# Patient Record
Sex: Male | Born: 1960 | Race: White | Hispanic: No | Marital: Married | Smoking: Never smoker
Health system: Southern US, Community
[De-identification: ages and names within clinical notes are randomized; demographics above are authoritative.]

## PROBLEM LIST (undated history)

## (undated) DIAGNOSIS — I10 Essential (primary) hypertension: Secondary | ICD-10-CM

## (undated) DIAGNOSIS — E785 Hyperlipidemia, unspecified: Secondary | ICD-10-CM

## (undated) DIAGNOSIS — M5416 Radiculopathy, lumbar region: Secondary | ICD-10-CM

## (undated) DIAGNOSIS — L570 Actinic keratosis: Secondary | ICD-10-CM

## (undated) DIAGNOSIS — E119 Type 2 diabetes mellitus without complications: Secondary | ICD-10-CM

## (undated) DIAGNOSIS — M51369 Other intervertebral disc degeneration, lumbar region without mention of lumbar back pain or lower extremity pain: Secondary | ICD-10-CM

## (undated) DIAGNOSIS — M1990 Unspecified osteoarthritis, unspecified site: Secondary | ICD-10-CM

## (undated) DIAGNOSIS — M5136 Other intervertebral disc degeneration, lumbar region: Secondary | ICD-10-CM

## (undated) DIAGNOSIS — N402 Nodular prostate without lower urinary tract symptoms: Secondary | ICD-10-CM

## (undated) HISTORY — DX: Nodular prostate without lower urinary tract symptoms: N40.2

## (undated) HISTORY — DX: Type 2 diabetes mellitus without complications: E11.9

## (undated) HISTORY — PX: HERNIA REPAIR: SHX51

## (undated) HISTORY — DX: Other intervertebral disc degeneration, lumbar region without mention of lumbar back pain or lower extremity pain: M51.369

## (undated) HISTORY — DX: Actinic keratosis: L57.0

## (undated) HISTORY — DX: Radiculopathy, lumbar region: M54.16

## (undated) HISTORY — DX: Hyperlipidemia, unspecified: E78.5

## (undated) HISTORY — PX: SHOULDER SURGERY: SHX246

## (undated) HISTORY — DX: Other intervertebral disc degeneration, lumbar region: M51.36

---

## 2008-09-21 ENCOUNTER — Ambulatory Visit: Payer: Self-pay | Admitting: Family Medicine

## 2008-10-03 ENCOUNTER — Ambulatory Visit: Payer: Self-pay | Admitting: Family Medicine

## 2008-10-15 ENCOUNTER — Ambulatory Visit: Payer: Self-pay

## 2010-12-30 ENCOUNTER — Ambulatory Visit: Payer: Self-pay | Admitting: Family Medicine

## 2012-09-30 ENCOUNTER — Ambulatory Visit: Payer: Self-pay | Admitting: Unknown Physician Specialty

## 2012-10-05 LAB — PATHOLOGY REPORT

## 2014-03-23 ENCOUNTER — Ambulatory Visit (INDEPENDENT_AMBULATORY_CARE_PROVIDER_SITE_OTHER): Payer: BC Managed Care – PPO | Admitting: Podiatry

## 2014-03-23 ENCOUNTER — Encounter: Payer: Self-pay | Admitting: Podiatry

## 2014-03-23 ENCOUNTER — Ambulatory Visit (INDEPENDENT_AMBULATORY_CARE_PROVIDER_SITE_OTHER): Payer: BC Managed Care – PPO

## 2014-03-23 VITALS — Resp 16 | Ht 67.0 in | Wt 190.0 lb

## 2014-03-23 DIAGNOSIS — M722 Plantar fascial fibromatosis: Secondary | ICD-10-CM

## 2014-03-23 DIAGNOSIS — M79609 Pain in unspecified limb: Secondary | ICD-10-CM

## 2014-03-23 DIAGNOSIS — M79673 Pain in unspecified foot: Secondary | ICD-10-CM

## 2014-03-23 MED ORDER — DICLOFENAC SODIUM 75 MG PO TBEC: 75.0000 mg | DELAYED_RELEASE_TABLET | Freq: Two times a day (BID) | ORAL | Status: DC

## 2014-03-23 MED ORDER — TRIAMCINOLONE ACETONIDE 10 MG/ML IJ SUSP
10.0000 mg | Freq: Once | INTRAMUSCULAR | Status: AC
  Administered 2014-03-23: 10 mg

## 2014-03-23 NOTE — Progress Notes (Signed)
   Subjective:    Patient ID: Jorge Grant, male    DOB: 12-Sep-1961, 53 y.o.   MRN: [id number]  HPI Comments: N pain L rt heel D 3 - 4 m O ? C worse A walking, shoes T 0  Foot Pain      Review of Systems  All other systems reviewed and are negative.      Objective:   Physical Exam        Assessment & Plan:

## 2014-03-23 NOTE — Patient Instructions (Signed)

## 2014-03-23 NOTE — Progress Notes (Signed)
Subjective:     Patient ID: Jorge Grant, male   DOB: 09-29-1961, 53 y.o.   MRN: [id number]  Foot Pain   patient points of the right heel stating that has been hurting a lot and making walking difficult and he had stand. States it's been present for around 4 months   Review of Systems  All other systems reviewed and are negative.      Objective:   Physical Exam  Nursing note and vitals reviewed. Constitutional: He is oriented to person, place, and time.  Cardiovascular: Intact distal pulses.   Musculoskeletal: Normal range of motion.  Neurological: He is oriented to person, place, and time.  Skin: Skin is warm.   neurovascular status intact with muscle strength adequate in range of motion subtalar and midtarsal joint within normal limits. Patient is found to have pain in the medial fascial band right at the insertion of the tendon into the calcaneus with fluid buildup noted around the insertion     Assessment:     Plantar fasciitis right heel with inflammation and fluid around the medial band at the insertion    Plan:     H&P and x-ray reviewed. Injected the right plantar fascia 3 mg Kenalog 5 mg arch and Marcaine mixture dispense fascial brace placed on Voltaren 75 mg twice a day and gave instructions on physical therapy. Discussed long-term orthotics and reappoint in 1 week

## 2014-03-30 ENCOUNTER — Ambulatory Visit (INDEPENDENT_AMBULATORY_CARE_PROVIDER_SITE_OTHER): Payer: BC Managed Care – PPO | Admitting: Podiatry

## 2014-03-30 ENCOUNTER — Encounter: Payer: Self-pay | Admitting: Podiatry

## 2014-03-30 VITALS — BP 134/70 | HR 84 | Resp 16

## 2014-03-30 DIAGNOSIS — M722 Plantar fascial fibromatosis: Secondary | ICD-10-CM

## 2014-03-30 MED ORDER — TRIAMCINOLONE ACETONIDE 10 MG/ML IJ SUSP
10.0000 mg | Freq: Once | INTRAMUSCULAR | Status: AC
  Administered 2014-03-30: 10 mg

## 2014-04-01 NOTE — Progress Notes (Signed)
Subjective:     Patient ID: Jorge Grant, male   DOB: [birth year], 52 y.o.   MRN: [id number]  HPI patient presents with continued chronic discomfort of the right plantar fascia at the insertion of the tendon into the calcaneus with trouble mostly with weightbearing and ambulation   Review of Systems     Objective:   Physical Exam Neurovascular status intact with continued discomfort in the right plantar fascia at the insertion of the tendon into the calcaneus    Assessment:     Plantar fasciitis right heel with inflammation and fluid at the calcaneal tendon insertion    Plan:     H&P reviewed and today discussed the chronic nature of his foot structure. I recommended long-term orthotics and scanned for custom orthotic devices and today I injected the right plantar fascia 3 mg Kenalog 5 mg Xylocaine Marcaine mixture and instructed on physical therapy. Reappoint to recheck

## 2014-04-12 ENCOUNTER — Encounter: Payer: Self-pay | Admitting: *Deleted

## 2014-04-12 NOTE — Progress Notes (Signed)
SENT PT POST CARD LETTING HIM KNOW ORTHOTICS ARE HERE. 

## 2014-04-27 ENCOUNTER — Other Ambulatory Visit: Payer: BC Managed Care – PPO

## 2015-11-29 ENCOUNTER — Encounter: Payer: Self-pay | Admitting: *Deleted

## 2015-11-29 ENCOUNTER — Encounter
Admission: RE | Disposition: A | Discharge status: Home Health | Payer: Self-pay | Source: Ambulatory Visit | Attending: Unknown Physician Specialty

## 2015-11-29 ENCOUNTER — Ambulatory Visit
Admission: RE | Admit: 2015-11-29 | Discharge: 2015-11-29 | Disposition: A | Discharge status: Home Health | Payer: BLUE CROSS/BLUE SHIELD | Source: Ambulatory Visit | Attending: Unknown Physician Specialty | Admitting: Unknown Physician Specialty

## 2015-11-29 ENCOUNTER — Ambulatory Visit: Payer: BLUE CROSS/BLUE SHIELD | Admitting: Anesthesiology

## 2015-11-29 DIAGNOSIS — I10 Essential (primary) hypertension: Secondary | ICD-10-CM | POA: Insufficient documentation

## 2015-11-29 DIAGNOSIS — K640 First degree hemorrhoids: Secondary | ICD-10-CM | POA: Diagnosis not present

## 2015-11-29 DIAGNOSIS — Z1211 Encounter for screening for malignant neoplasm of colon: Secondary | ICD-10-CM | POA: Insufficient documentation

## 2015-11-29 DIAGNOSIS — K621 Rectal polyp: Secondary | ICD-10-CM | POA: Insufficient documentation

## 2015-11-29 DIAGNOSIS — D125 Benign neoplasm of sigmoid colon: Secondary | ICD-10-CM | POA: Diagnosis not present

## 2015-11-29 HISTORY — DX: Essential (primary) hypertension: I10

## 2015-11-29 HISTORY — PX: COLONOSCOPY: SHX5424

## 2015-11-29 SURGERY — COLONOSCOPY
Anesthesia: General

## 2015-11-29 MED ORDER — MIDAZOLAM HCL 2 MG/2ML IJ SOLN
INTRAMUSCULAR | Status: DC | PRN
  Administered 2015-11-29: 2 mg via INTRAVENOUS

## 2015-11-29 MED ORDER — SODIUM CHLORIDE 0.9 % IV SOLN
INTRAVENOUS | Status: DC
  Administered 2015-11-29: 10000 mL via INTRAVENOUS

## 2015-11-29 MED ORDER — SODIUM CHLORIDE 0.9 % IV SOLN
INTRAVENOUS | Status: DC
  Administered 2015-11-29: 11:00:00 via INTRAVENOUS

## 2015-11-29 MED ORDER — PROPOFOL 500 MG/50ML IV EMUL
INTRAVENOUS | Status: DC | PRN
Start: 2015-11-29 — End: 2015-11-29
  Administered 2015-11-29: 160 ug/kg/min via INTRAVENOUS

## 2015-11-29 MED ORDER — LIDOCAINE HCL (CARDIAC) 20 MG/ML IV SOLN
INTRAVENOUS | Status: DC | PRN
  Administered 2015-11-29: 80 mg via INTRAVENOUS

## 2015-11-29 MED ORDER — EPHEDRINE SULFATE 50 MG/ML IJ SOLN
INTRAMUSCULAR | Status: DC | PRN
  Administered 2015-11-29: 10 mg via INTRAVENOUS

## 2015-11-29 NOTE — Transfer of Care (Signed)
Immediate Anesthesia Transfer of Care Note  Patient: Jorge Grant  Procedure(s) Performed: Procedure(s): COLONOSCOPY (N/A)  Patient Location: PACU and Endoscopy Unit  Anesthesia Type:General  Level of Consciousness: sedated  Airway & Oxygen Therapy: Patient Spontanous Breathing and Patient connected to nasal cannula oxygen  Post-op Assessment: Report given to RN and Post -op Vital signs reviewed and stable  Post vital signs: Reviewed and stable  Last Vitals: 11:19 97.2 104hr 96% 105/59 18 resp Filed Vitals:   11/29/15 0941  BP: 137/78  Pulse: 82  Temp: 36.2 C  Resp: 20    Complications: No apparent anesthesia complications

## 2015-11-29 NOTE — Addendum Note (Signed)
Addendum  created 11/29/15 1320 by Alvin Critchley, MD   Modules edited: Clinical Notes   Clinical Notes:  File: [id number]

## 2015-11-29 NOTE — Anesthesia Postprocedure Evaluation (Addendum)
Anesthesia Post Note  Patient: Jorge Grant  Procedure(s) Performed: Procedure(s) (LRB): COLONOSCOPY (N/A)  Patient location during evaluation: Endoscopy Anesthesia Type: General Level of consciousness: awake, awake and alert and oriented Pain management: pain level controlled Vital Signs Assessment: post-procedure vital signs reviewed and stable Respiratory status: spontaneous breathing Cardiovascular status: blood pressure returned to baseline Anesthetic complications: no    Last Vitals:  Filed Vitals:   11/29/15 1138 11/29/15 1148  BP: 109/63 105/70  Pulse: 77 82  Temp:    Resp: 28 19    Last Pain:  Filed Vitals:   11/29/15 1155  PainSc: Asleep                 Haruki Arnold

## 2015-11-29 NOTE — Anesthesia Preprocedure Evaluation (Signed)
Anesthesia Evaluation  Patient identified by MRN, date of birth, ID band Patient awake    Reviewed: Allergy & Precautions, NPO status , Patient's Chart, lab work & pertinent test results  Airway Mallampati: III  TM Distance: >3 FB Neck ROM: Full    Dental  (+) Chipped, Caps   Pulmonary neg pulmonary ROS,    Pulmonary exam normal breath sounds clear to auscultation       Cardiovascular hypertension, Pt. on medications Normal cardiovascular exam     Neuro/Psych negative neurological ROS  negative psych ROS   GI/Hepatic negative GI ROS, Neg liver ROS,   Endo/Other  negative endocrine ROS  Renal/GU negative Renal ROS  negative genitourinary   Musculoskeletal negative musculoskeletal ROS (+)   Abdominal Normal abdominal exam  (+)   Peds negative pediatric ROS (+)  Hematology negative hematology ROS (+)   Anesthesia Other Findings   Reproductive/Obstetrics                             Anesthesia Physical Anesthesia Plan  ASA: II  Anesthesia Plan: General   Post-op Pain Management:    Induction: Intravenous  Airway Management Planned: Nasal Cannula  Additional Equipment:   Intra-op Plan:   Post-operative Plan:   Informed Consent: I have reviewed the patients History and Physical, chart, labs and discussed the procedure including the risks, benefits and alternatives for the proposed anesthesia with the patient or authorized representative who has indicated his/her understanding and acceptance.   Dental advisory given  Plan Discussed with: CRNA and Surgeon  Anesthesia Plan Comments:         Anesthesia Quick Evaluation

## 2015-11-29 NOTE — H&P (Signed)
   Primary Care Physician:  Johna Roles, MD Primary Gastroenterologist:  Dr. Vira Agar  Pre-Procedure History & Physical: HPI:  Jorge Grant is a 54 y.o. male is here for an colonoscopy.   Past Medical History  Diagnosis Date  . Hypertension     Past Surgical History  Procedure Laterality Date  . Shoulder surgery Right   . Hernia repair      Prior to Admission medications   Medication Sig Start Date End Date Taking? Authorizing Provider  lisinopril (PRINIVIL,ZESTRIL) 10 MG tablet Take 10 mg by mouth daily.   Yes Historical Provider, MD  diclofenac (VOLTAREN) 75 MG EC tablet Take 1 tablet (75 mg total) by mouth 2 (two) times daily. 03/23/14   Wallene Huh, DPM  traMADol (ULTRAM) 50 MG tablet Take by mouth as needed.    Historical Provider, MD    Allergies as of 09/30/2015  . (No Known Allergies)    History reviewed. No pertinent family history.  Social History   Social History  . Marital Status: Married    Spouse Name: N/A  . Number of Children: N/A  . Years of Education: N/A   Occupational History  . Not on file.   Social History Main Topics  . Smoking status: Never Smoker   . Smokeless tobacco: Not on file  . Alcohol Use: Yes  . Drug Use: Not on file  . Sexual Activity: Not on file   Other Topics Concern  . Not on file   Social History Narrative    Review of Systems: See HPI, otherwise negative ROS  Physical Exam: BP 137/78 mmHg  Pulse 82  Temp(Src) 97.2 F (36.2 C) (Tympanic)  Resp 20  Ht 5\' 7"  (1.702 m)  Wt 86.183 kg (190 lb)  BMI 29.75 kg/m2  SpO2 98% General:   Alert,  pleasant and cooperative in NAD Head:  Normocephalic and atraumatic. Neck:  Supple; no masses or thyromegaly. Lungs:  Clear throughout to auscultation.    Heart:  Regular rate and rhythm. Abdomen:  Soft, nontender and nondistended. Normal bowel sounds, without guarding, and without rebound.   Neurologic:  Alert and  oriented x4;  grossly normal  neurologically.  Impression/Plan: Jorge Grant is here for an colonoscopy to be performed for [facility] colon polyps  Risks, benefits, limitations, and alternatives regarding  colonoscopy have been reviewed with the patient.  Questions have been answered.  All parties agreeable.   Gaylyn Cheers, MD  11/29/2015, 10:40 AM

## 2015-11-29 NOTE — Op Note (Signed)
[facility] Gastroenterology Patient Name: Jorge Grant Procedure Date: 11/29/2015 10:29 AM MRN: [id number] Account #: [id number] Date of Birth: 1961/11/09 Admit Type: Outpatient Age: 54 Room: [facility] ENDO ROOM 1 Gender: Male Note Status: Finalized Procedure:         Colonoscopy Indications:       High risk colon cancer surveillance: Personal history of                     sessile serrated colon polyp (10 mm or greater in size) Providers:         Manya Silvas, MD Referring MD:      Irven Easterly. Kary Kos, MD (Referring MD) Medicines:         Propofol per Anesthesia Complications:     No immediate complications. Procedure:         Pre-Anesthesia Assessment:                    - After reviewing the risks and benefits, the patient was                     deemed in satisfactory condition to undergo the procedure.                    After obtaining informed consent, the colonoscope was                     passed under direct vision. Throughout the procedure, the                     patient's blood pressure, pulse, and oxygen saturations                     were monitored continuously. The Colonoscope was                     introduced through the anus and advanced to the the cecum,                     identified by appendiceal orifice and ileocecal valve. The                     colonoscopy was performed without difficulty. The patient                     tolerated the procedure well. The quality of the bowel                     preparation was excellent. Findings:      Four sessile polyps were found in the rectum and in the sigmoid colon.       The polyps were diminutive in size. These polyps were removed with a       jumbo cold forceps. Resection and retrieval were complete.      Internal hemorrhoids were found during endoscopy. The hemorrhoids were       small and Grade I (internal hemorrhoids that do not prolapse).      The exam was otherwise without  abnormality. Impression:        - Four diminutive polyps in the rectum and in the sigmoid                     colon. Resected and retrieved.                    -  Internal hemorrhoids.                    - The examination was otherwise normal. Recommendation:    - Await pathology results. Manya Silvas, MD 11/29/2015 11:14:55 AM This report has been signed electronically. Number of Addenda: 0 Note Initiated On: 11/29/2015 10:29 AM Scope Withdrawal Time: 0 hours 16 minutes 8 seconds  Total Procedure Duration: 0 hours 26 minutes 9 seconds       [facility]

## 2015-11-30 ENCOUNTER — Encounter: Payer: Self-pay | Admitting: Unknown Physician Specialty

## 2015-12-02 LAB — SURGICAL PATHOLOGY

## 2016-11-16 ENCOUNTER — Encounter: Payer: Self-pay | Admitting: Urology

## 2016-11-16 ENCOUNTER — Ambulatory Visit (INDEPENDENT_AMBULATORY_CARE_PROVIDER_SITE_OTHER): Payer: BLUE CROSS/BLUE SHIELD | Admitting: Urology

## 2016-11-16 DIAGNOSIS — N402 Nodular prostate without lower urinary tract symptoms: Secondary | ICD-10-CM

## 2016-11-16 NOTE — Progress Notes (Signed)
11/16/2016 12:30 PM   Jorge Grant [birth year] [id number]  Referring provider: Johna Roles, MD [place] [address]  Chief Complaint  Patient presents with  . New Patient (Initial Visit)    prostate assymetric    HPI: The patient presents today for further evaluation of an abnormal rectal exam. This was first noted approximately one month ago by his primary care doctor. At that time the patient'sPSA was 0.65. The patient states that he has had some progressive post void dribbling, although this I long-standing. He is currently not taking any medications for. He describes no additional voiding symptoms including nocturia, incomplete bladder emptying, weak stream, or incontinence. He has no history of recurrent urinary tract infections.  The patient has a very large family with no history of prostate cancer.  The patient has started noticing worsening erectile dysfunction. However, untreated he still able to engage in intercourse and often it is adequate for completion.     PMH: Past Medical History:  Diagnosis Date  . Hypertension     Surgical History: Past Surgical History:  Procedure Laterality Date  . COLONOSCOPY N/A 11/29/2015   Procedure: COLONOSCOPY;  Surgeon: Manya Silvas, MD;  Location: [facility] ENDOSCOPY;  Service: Endoscopy;  Laterality: N/A;  . HERNIA REPAIR    . SHOULDER SURGERY Right     Home Medications:    Medication List       Accurate as of 11/16/16 12:30 PM. Always use your most recent med list.          aspirin EC 81 MG tablet Take 81 mg by mouth daily.   lisinopril-hydrochlorothiazide 10-12.5 MG tablet Commonly known as:  PRINZIDE,ZESTORETIC TAKE 1 TABLET BY MOUTH DAILY FOR BLOOD PRESSURE AND FLUID   traMADol 50 MG tablet Commonly known as:  ULTRAM TAKE 1 OR 2 TABLETS EVERY 6 HOURS AS NEEDED FOR PAIN   traZODone 50 MG tablet Commonly known as:  DESYREL Take by mouth.       Allergies: No Known  Allergies  Family History: Family History  Problem Relation Age of Onset  . Hematuria Neg Hx   . Prostate cancer Neg Hx     Social History:  reports that he has never smoked. He does not have any smokeless tobacco history on file. He reports that he drinks alcohol. His drug history is not on file.  ROS: UROLOGY Frequent Urination?: No Hard to postpone urination?: No Burning/pain with urination?: No Get up at night to urinate?: No Leakage of urine?: No Urine stream starts and stops?: No Trouble starting stream?: No Do you have to strain to urinate?: No Blood in urine?: No Urinary tract infection?: No Sexually transmitted disease?: No Injury to kidneys or bladder?: No Painful intercourse?: No Weak stream?: No Erection problems?: No Penile pain?: No  Gastrointestinal Nausea?: No Vomiting?: No Indigestion/heartburn?: No Diarrhea?: No Constipation?: No  Constitutional Fever: No Night sweats?: No Weight loss?: No Fatigue?: No  Skin Skin rash/lesions?: No Itching?: No  Eyes Blurred vision?: No Double vision?: No  Ears/Nose/Throat Sore throat?: No Sinus problems?: No  Hematologic/Lymphatic Swollen glands?: No Easy bruising?: No  Cardiovascular Leg swelling?: No Chest pain?: No  Respiratory Cough?: No Shortness of breath?: No  Endocrine Excessive thirst?: No  Musculoskeletal Back pain?: No Joint pain?: No  Neurological Headaches?: No Dizziness?: No  Psychologic Depression?: No Anxiety?: No  Physical Exam: There were no vitals taken for this visit.  Constitutional:  Alert and oriented, No acute distress. HEENT:  [place] AT, moist mucus membranes.  Trachea midline, no masses. Cardiovascular: No clubbing, cyanosis, or edema. Respiratory: Normal respiratory effort, no increased work of breathing. GI: Abdomen is soft, nontender, nondistended, no abdominal masses GU: No CVA tenderness. The patient has a 5 mm nodule in the left prostatic base that is  smooth and confined to the prostate. There is no nodulthe patient's right side. Skin: No rashes, bruises or suspicious lesions. Lymph: No cervical or inguinal adenopathy. Neurologic: Grossly intact, no focal deficits, moving all 4 extremities. Psychiatric: Normal mood and affect.  Laboratory Data: No results found for: WBC, HGB, HCT, MCV, PLT  No results found for: CREATININE  No results found for: PSA  No results found for: TESTOSTERONE  No results found for: HGBA1C  Urinalysis No results found for: COLORURINE, APPEARANCEUR, LABSPEC, PHURINE, GLUCOSEU, HGBUR, BILIRUBINUR, KETONESUR, PROTEINUR, UROBILINOGEN, NITRITE, LEUKOCYTESUR  Pertinent Imaging: none  Assessment & Plan:  The patient has a 5 mm nodule on the right left space of his prostate which is smooth and confined to the prostate likely an adenoma. I explained the findings with the patient and reassured him. Especially in light of a normal PSA. We will plan to repeat the patient's rectal exam in 6 months to ensure that it is not changing or becoming worrisome. Hopefully after several serial prostate exams we can explain the interval to annual. In addition, we discussed his erectile dysfunction, this point the patient is not interested in any medication or treatment but will contact us if he would like to start some. For the patient's voiding symptoms, I recommended that we continue to monitor these but not start any therapy as of yet.  1. Nodular prostate Return in 6 months for repeat DRE  Cc: Dr. Garner Gavel, M.D.  Return in about 6 months (around 05/17/2017).  Ardis Hughs, [facility] [address] [place], [place] [address] [phone]

## 2017-01-14 DIAGNOSIS — J209 Acute bronchitis, unspecified: Secondary | ICD-10-CM | POA: Diagnosis not present

## 2017-02-01 DIAGNOSIS — L570 Actinic keratosis: Secondary | ICD-10-CM | POA: Diagnosis not present

## 2017-02-01 DIAGNOSIS — C4491 Basal cell carcinoma of skin, unspecified: Secondary | ICD-10-CM

## 2017-02-01 DIAGNOSIS — L821 Other seborrheic keratosis: Secondary | ICD-10-CM | POA: Diagnosis not present

## 2017-02-01 DIAGNOSIS — L578 Other skin changes due to chronic exposure to nonionizing radiation: Secondary | ICD-10-CM | POA: Diagnosis not present

## 2017-02-01 DIAGNOSIS — C44319 Basal cell carcinoma of skin of other parts of face: Secondary | ICD-10-CM | POA: Diagnosis not present

## 2017-02-01 DIAGNOSIS — D485 Neoplasm of uncertain behavior of skin: Secondary | ICD-10-CM | POA: Diagnosis not present

## 2017-02-01 DIAGNOSIS — L718 Other rosacea: Secondary | ICD-10-CM | POA: Diagnosis not present

## 2017-02-01 HISTORY — DX: Basal cell carcinoma of skin, unspecified: C44.91

## 2017-02-04 DIAGNOSIS — R05 Cough: Secondary | ICD-10-CM | POA: Diagnosis not present

## 2017-03-16 DIAGNOSIS — C44319 Basal cell carcinoma of skin of other parts of face: Secondary | ICD-10-CM | POA: Diagnosis not present

## 2017-06-01 ENCOUNTER — Encounter: Payer: Self-pay | Admitting: Urology

## 2017-06-01 ENCOUNTER — Ambulatory Visit (INDEPENDENT_AMBULATORY_CARE_PROVIDER_SITE_OTHER): Payer: 59 | Admitting: Urology

## 2017-06-01 VITALS — BP 124/67 | HR 93 | Ht 67.0 in | Wt 203.2 lb

## 2017-06-01 DIAGNOSIS — N402 Nodular prostate without lower urinary tract symptoms: Secondary | ICD-10-CM | POA: Diagnosis not present

## 2017-06-01 NOTE — Progress Notes (Signed)
06/01/2017 5:51 PM   Jorge Grant 01/26/1961 [id number]  Referring provider: Johna Roles, [place] [address]  Chief Complaint  Patient presents with  . Other    Nodular prostate    HPI: The patient presents today for further evaluation of an abnormal rectal exam. This was first noted approximately one month ago by his primary care doctor. At that time the patient'sPSA was 0.65. The patient states that he has had some progressive post void dribbling, although this I long-standing. He is currently not taking any medications for. He describes no additional voiding symptoms including nocturia, incomplete bladder emptying, weak stream, or incontinence. He has no history of recurrent urinary tract infections.  The patient has a very large family with no history of prostate cancer.  The patient has started noticing worsening erectile dysfunction. However, untreated he still able to engage in intercourse and often it is adequate for completion.    Interval: The patient presents today for further evaluation of a small prostate nodule. He was noted to have a 5 mm nodule at the left apex. He denies any progressive voiding symptoms. He denies any gross hematuria or dysuria. Denies any constitutional symptoms. Denies any change to his urinary stream.   PMH: Past Medical History:  Diagnosis Date  . Hypertension     Surgical History: Past Surgical History:  Procedure Laterality Date  . COLONOSCOPY N/A 11/29/2015   Procedure: COLONOSCOPY;  Surgeon: Manya Silvas, MD;  Location: [facility] ENDOSCOPY;  Service: Endoscopy;  Laterality: N/A;  . HERNIA REPAIR    . SHOULDER SURGERY Right     Home Medications:  Allergies as of 06/01/2017   No Known Allergies     Medication List       Accurate as of 06/01/17  5:51 PM. Always use your most recent med list.          albuterol 108 (90 Base) MCG/ACT inhaler Commonly known as:  PROVENTIL HFA;VENTOLIN  HFA Inhale into the lungs.   aspirin EC 81 MG tablet Take 81 mg by mouth daily.   fluticasone 50 MCG/ACT nasal spray Commonly known as:  FLONASE Place into the nose.   lisinopril-hydrochlorothiazide 10-12.5 MG tablet Commonly known as:  PRINZIDE,ZESTORETIC TAKE 1 TABLET BY MOUTH DAILY FOR BLOOD PRESSURE AND FLUID   traMADol 50 MG tablet Commonly known as:  ULTRAM TAKE 1 OR 2 TABLETS EVERY 6 HOURS AS NEEDED FOR PAIN   traZODone 50 MG tablet Commonly known as:  DESYREL Take by mouth.       Allergies: No Known Allergies  Family History: Family History  Problem Relation Age of Onset  . Hematuria Neg Hx   . Prostate cancer Neg Hx     Social History:  reports that he has never smoked. He has quit using smokeless tobacco. His smokeless tobacco use included Chew. He reports that he drinks alcohol. His drug history is not on file.  ROS: UROLOGY Frequent Urination?: No Hard to postpone urination?: No Burning/pain with urination?: No Get up at night to urinate?: No Leakage of urine?: No Urine stream starts and stops?: No Trouble starting stream?: No Do you have to strain to urinate?: No Blood in urine?: No Urinary tract infection?: No Sexually transmitted disease?: No Injury to kidneys or bladder?: No Painful intercourse?: No Weak stream?: No Erection problems?: No Penile pain?: No  Gastrointestinal Nausea?: No Vomiting?: No Indigestion/heartburn?: No Diarrhea?: No Constipation?: No  Constitutional Fever: No Night sweats?: No Weight  loss?: No Fatigue?: No  Skin Skin rash/lesions?: No Itching?: No  Eyes Blurred vision?: No Double vision?: No  Ears/Nose/Throat Sore throat?: No Sinus problems?: No  Hematologic/Lymphatic Swollen glands?: No Easy bruising?: No  Cardiovascular Leg swelling?: No Chest pain?: No  Respiratory Cough?: No Shortness of breath?: No  Endocrine Excessive thirst?: No  Musculoskeletal Back pain?: No Joint pain?:  No  Neurological Headaches?: No Dizziness?: No  Psychologic Depression?: No Anxiety?: No  Physical Exam: BP 124/67 (BP Location: Left Arm, Patient Position: Sitting, Cuff Size: Normal)   Pulse 93   Ht 5\' 7"  (1.702 m)   Wt 92.2 kg (203 lb 3.2 oz)   BMI 31.83 kg/m   Constitutional:  Alert and oriented, No acute distress. HEENT: [place] AT, moist mucus membranes.  Trachea midline, no masses. Cardiovascular: No clubbing, cyanosis, or edema. Respiratory: Normal respiratory effort, no increased work of breathing. GI: Abdomen is soft, nontender, nondistended, no abdominal masses GU: No CVA tenderness.  DRE: The patient has a palpable nodule in the left apex that measures approximately 5 mm, appears unchanged based on his last exam. His right apex and lateral lobes palpate nearly similar, but I did not appreciate a discrete nodule. The nodule appears to be within the capsule of the prostate.  Could also be a prominent left SV. Skin: No rashes, bruises or suspicious lesions. Lymph: No cervical or inguinal adenopathy. Neurologic: Grossly intact, no focal deficits, moving all 4 extremities. Psychiatric: Normal mood and affect.  Laboratory Data: No results found for: WBC, HGB, HCT, MCV, PLT  No results found for: CREATININE  No results found for: PSA  No results found for: TESTOSTERONE  No results found for: HGBA1C  Urinalysis No results found for: COLORURINE, APPEARANCEUR, LABSPEC, PHURINE, GLUCOSEU, HGBUR, BILIRUBINUR, KETONESUR, PROTEINUR, UROBILINOGEN, NITRITE, LEUKOCYTESUR  Pertinent Imaging: none  Assessment & Plan:  The patient has a stable nodule on the left apex of his prostate. I reassured the patient. I recommended that he return in 1 year with a repeat urine. The patient's PSAs have been checked by his PCP, I recommended that he continue to do this.  1. Nodular prostate Return in 6 months for repeat DRE  Cc: Dr. Garner Gavel, M.D.  Return in about 1 year (around  06/01/2018) for repeat DRE .  Ardis Hughs, [facility] [address], [place], [place] [address] [phone]

## 2018-01-24 DIAGNOSIS — Z0000 Encounter for general adult medical examination without abnormal findings: Secondary | ICD-10-CM | POA: Diagnosis not present

## 2018-01-24 DIAGNOSIS — R972 Elevated prostate specific antigen [PSA]: Secondary | ICD-10-CM | POA: Diagnosis not present

## 2018-01-31 DIAGNOSIS — R739 Hyperglycemia, unspecified: Secondary | ICD-10-CM | POA: Diagnosis not present

## 2018-01-31 DIAGNOSIS — I10 Essential (primary) hypertension: Secondary | ICD-10-CM | POA: Diagnosis not present

## 2018-01-31 DIAGNOSIS — Z0000 Encounter for general adult medical examination without abnormal findings: Secondary | ICD-10-CM | POA: Diagnosis not present

## 2018-06-02 ENCOUNTER — Ambulatory Visit: Payer: BLUE CROSS/BLUE SHIELD

## 2018-06-02 ENCOUNTER — Ambulatory Visit: Payer: 59

## 2018-08-01 DIAGNOSIS — L570 Actinic keratosis: Secondary | ICD-10-CM | POA: Diagnosis not present

## 2018-08-01 DIAGNOSIS — M71342 Other bursal cyst, left hand: Secondary | ICD-10-CM | POA: Diagnosis not present

## 2018-08-01 DIAGNOSIS — X32XXXA Exposure to sunlight, initial encounter: Secondary | ICD-10-CM | POA: Diagnosis not present

## 2018-08-01 DIAGNOSIS — D485 Neoplasm of uncertain behavior of skin: Secondary | ICD-10-CM | POA: Diagnosis not present

## 2018-08-01 DIAGNOSIS — D0462 Carcinoma in situ of skin of left upper limb, including shoulder: Secondary | ICD-10-CM | POA: Diagnosis not present

## 2018-09-06 DIAGNOSIS — D0462 Carcinoma in situ of skin of left upper limb, including shoulder: Secondary | ICD-10-CM | POA: Diagnosis not present

## 2021-03-21 ENCOUNTER — Other Ambulatory Visit: Payer: Self-pay | Admitting: Family Medicine

## 2021-03-21 DIAGNOSIS — M5416 Radiculopathy, lumbar region: Secondary | ICD-10-CM

## 2021-03-31 ENCOUNTER — Ambulatory Visit
Admission: RE | Admit: 2021-03-31 | Discharge: 2021-03-31 | Disposition: A | Discharge status: Home / Self Care | Payer: 59 | Source: Ambulatory Visit | Attending: Family Medicine | Admitting: Family Medicine

## 2021-03-31 ENCOUNTER — Other Ambulatory Visit: Payer: Self-pay

## 2021-03-31 DIAGNOSIS — M5416 Radiculopathy, lumbar region: Secondary | ICD-10-CM | POA: Insufficient documentation

## 2021-08-19 IMAGING — MR MR LUMBAR SPINE W/O CM
5 series · 31 of 48 positions shown · non-contrast
Comparison: None.

CLINICAL DATA: Low back and right leg pain for 8 months.

EXAM:
MRI LUMBAR SPINE WITHOUT CONTRAST
TECHNIQUE: Multiplanar, multisequence MR imaging of the lumbar spine was
performed. No intravenous contrast was administered.

[Series 5: T2 · sagittal · 4.0mm · 0.81mm/px · 6 of 17 slices shown (1 of 2)]
[im 1/17]
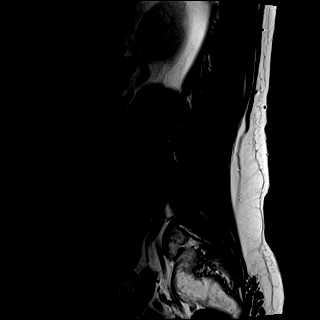
[im 4/17]
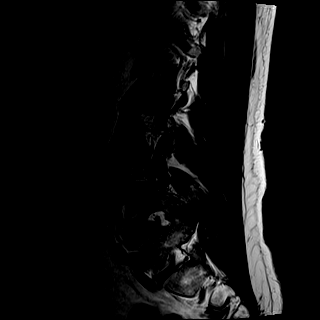
[im 7/17]
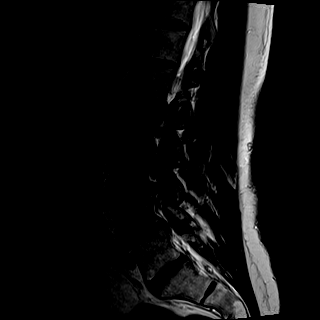
[im 10/17]
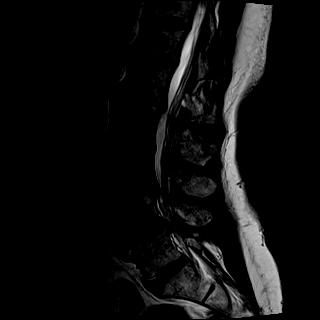
[im 13/17]
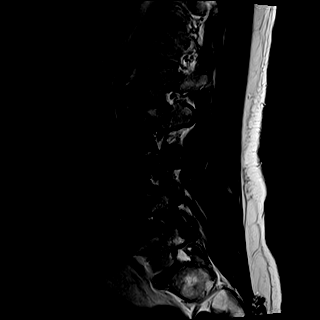
[im 17/17]
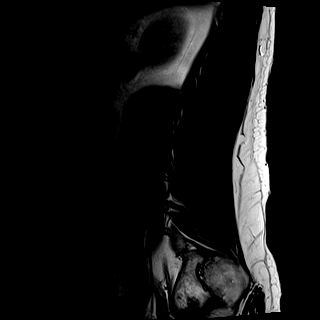

[Series 6: T1 · sagittal · 4.0mm · 0.81mm/px · 7 of 17 slices shown (1 of 2)]
[im 1/17]
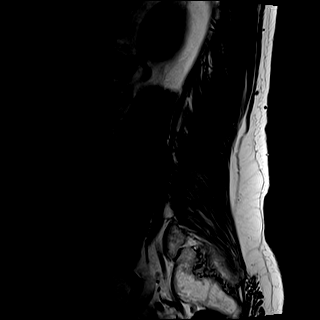
[im 3/17]
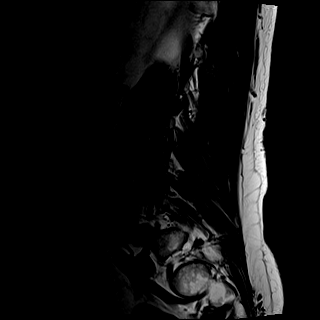
[im 6/17]
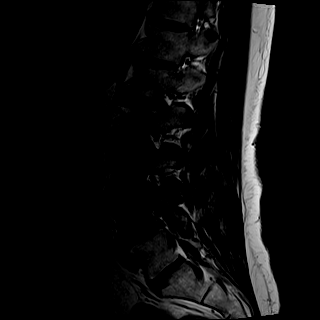
[im 9/17]
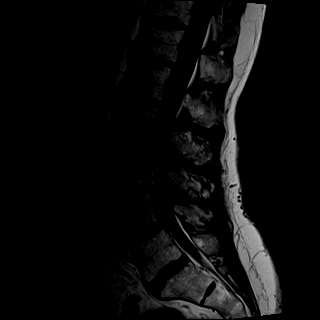
[im 11/17]
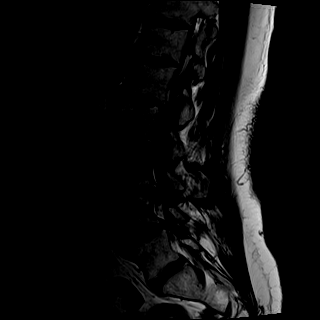
[im 14/17]
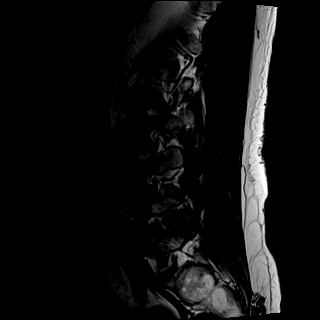
[im 17/17]
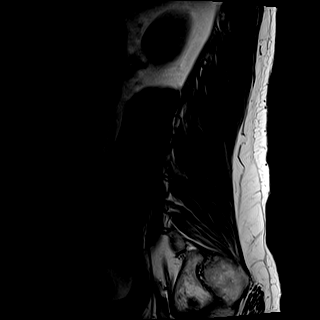

[Series 7: STIR · sagittal · 4.0mm · 0.41mm/px · 2 of 17 slices shown]
[im 1/17]
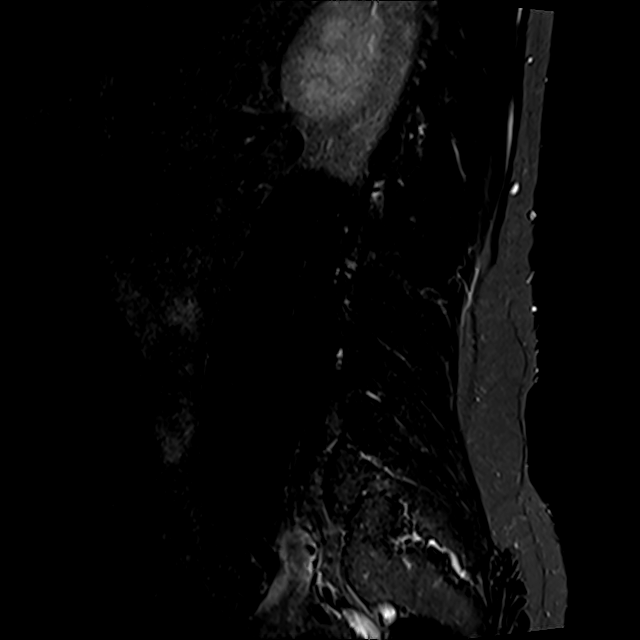
[im 3/17]
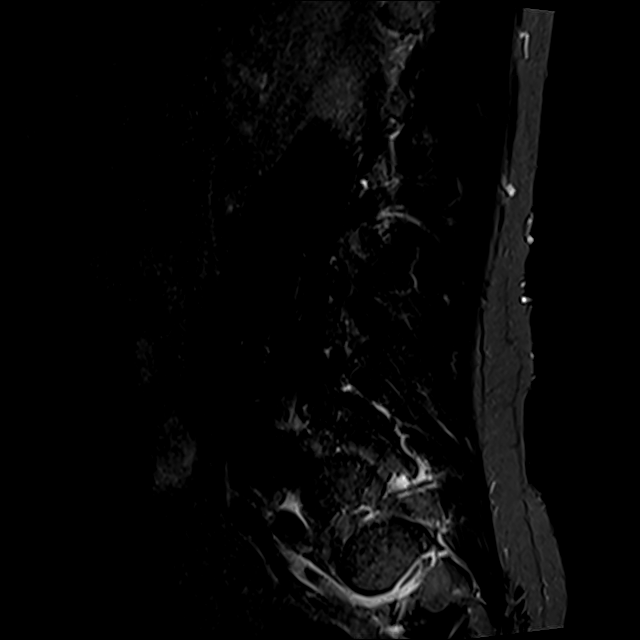

[Series 8: T2 · axial · 4.0mm · 0.78mm/px · z∈[-188,+14]mm · 8 of 36 slices shown (2 of 2)]
[im 1/36]
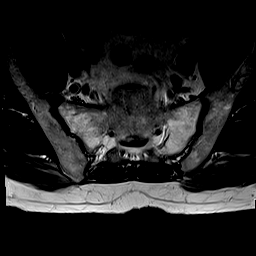
[im 6/36]
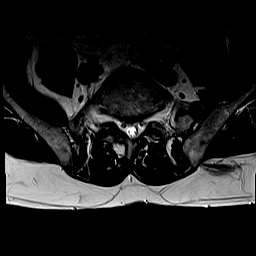
[im 11/36]
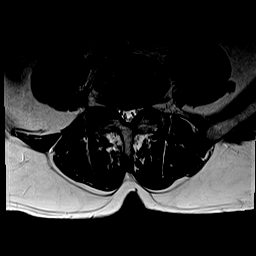
[im 17/36]
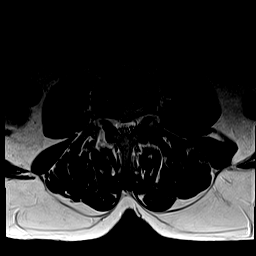
[im 19/36]
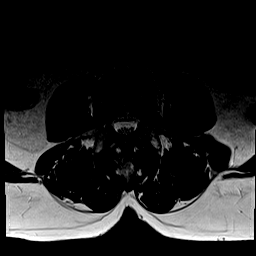
[im 25/36]
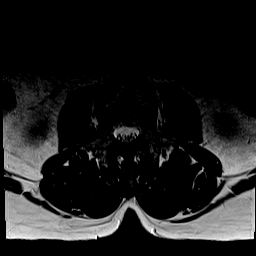
[im 30/36]
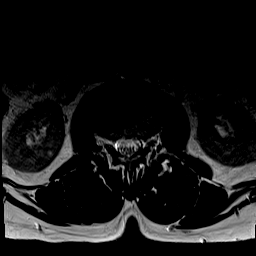
[im 36/36]
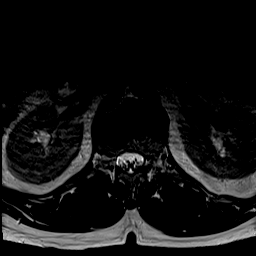

[Series 9: T1 · axial · 4.0mm · 0.39mm/px · z∈[-188,+14]mm · 8 of 36 slices shown (2 of 2)]
[im 1/36]
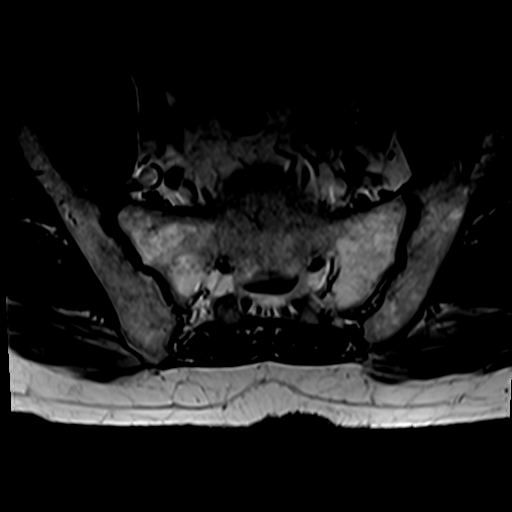
[im 6/36]
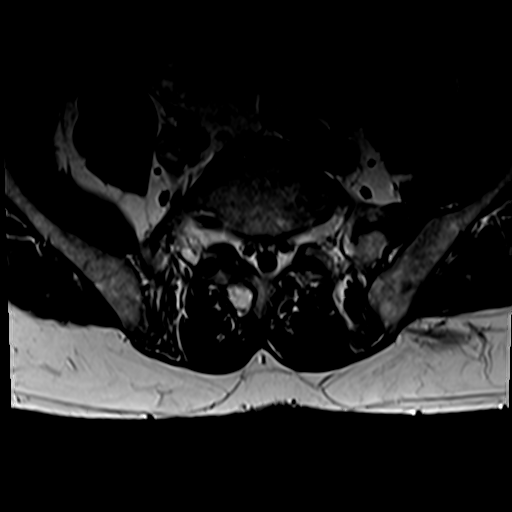
[im 11/36]
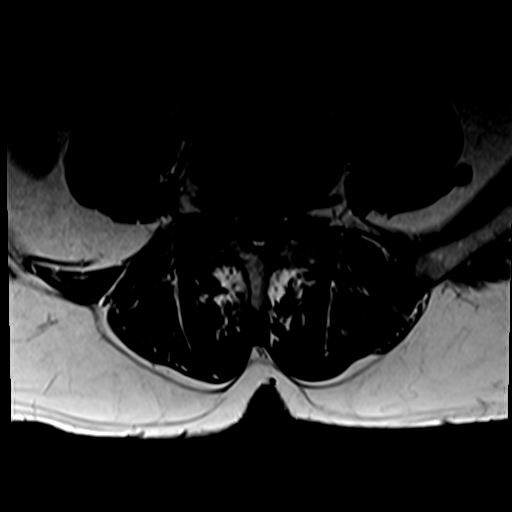
[im 17/36]
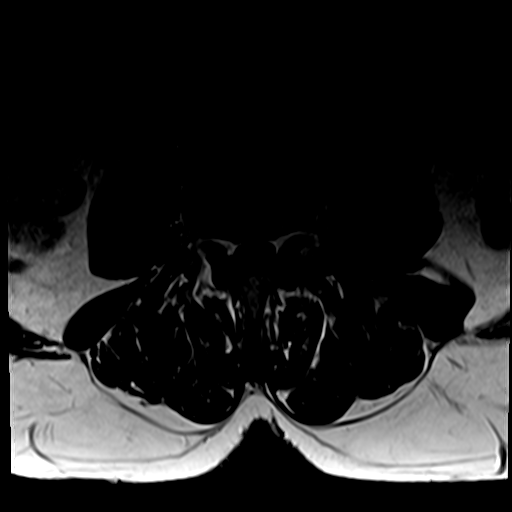
[im 19/36]
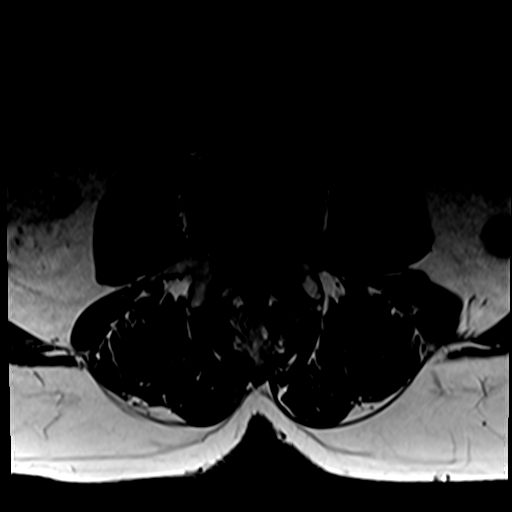
[im 25/36]
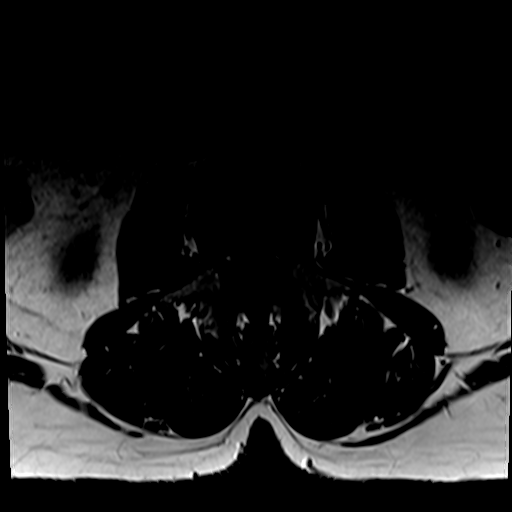
[im 30/36]
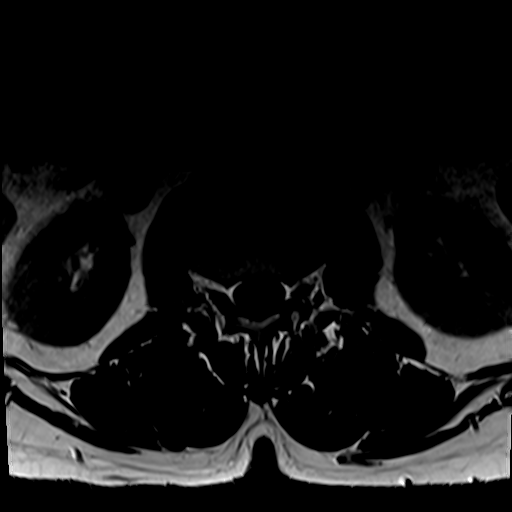
[im 36/36]
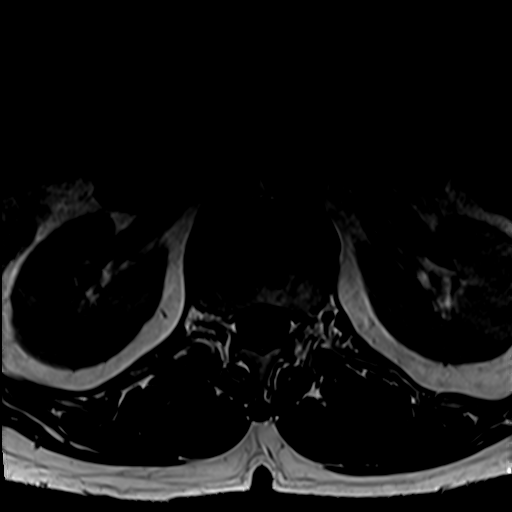

[31 of 48 positions shown; findings below may reference images not displayed]

FINDINGS: Segmentation:  Standard.

Alignment:  Trace anterolisthesis L4 on L5.

Vertebrae:  No fracture, evidence of discitis, or bone lesion.

Conus medullaris and cauda equina: Conus extends to the T12-L1
level. Conus and cauda equina appear normal.

Paraspinal and other soft tissues: Negative.

Disc levels:

T11-12 is imaged in the sagittal plane only and negative.

T12-L1: Negative.

L1-2: Shallow disc bulge. There is mild central canal narrowing. The
foramina are open.

L2-3: Shallow disc bulge with a small superimposed left paracentral
protrusion. There is mild narrowing in the left lateral recess with
some encroachment on the descending left L3 root. Neural foramina
are open.

L3-4: Loss of disc space height with a broad-based bulge eccentric
to the right. There is also some ligamentum flavum thickening.
Moderate central canal stenosis and narrowing of both subarticular
recesses. Mild left foraminal narrowing. The right foramen appears
open.

L4-5: Moderate to moderately severe facet degenerative disease is
worse on the right. The disc is uncovered with a shallow broad-based
central protrusion. There is mild to moderate central canal and
right subarticular recess narrowing. Moderate to moderately severe
foraminal narrowing is worse on the right.

L5-S1: Negative.
IMPRESSION: Moderate central canal stenosis and narrowing of both subarticular
recesses at L3-4. There is also mild left foraminal narrowing at
L3-4.

Mild to moderate central canal and right subarticular recess
narrowing at L4-5. Moderate to moderately severe bilateral foraminal
narrowing at L4-5 is worse on the right.

Shallow disc bulge and small left paracentral protrusion at L2-3
cause mild narrowing in the left lateral recess.

## 2021-08-29 ENCOUNTER — Ambulatory Visit (INDEPENDENT_AMBULATORY_CARE_PROVIDER_SITE_OTHER): Payer: 59 | Admitting: Urology

## 2021-08-29 ENCOUNTER — Other Ambulatory Visit: Payer: Self-pay

## 2021-08-29 VITALS — BP 123/76 | HR 73 | Ht 66.0 in | Wt 180.0 lb

## 2021-08-29 DIAGNOSIS — Z3009 Encounter for other general counseling and advice on contraception: Secondary | ICD-10-CM | POA: Diagnosis not present

## 2021-08-29 NOTE — Patient Instructions (Signed)

## 2021-08-29 NOTE — Progress Notes (Signed)
08/29/2021 1:09 PM   [name] 02/01/1961 [id number]  Referring provider: Maryland Pink, MD [address] [facility] [place],  [place] [address]  Chief Complaint  Patient presents with   VAS Consult    HPI: Jorge Grant is a 60 y.o. male who presents for vasectomy counseling.  Married without children He states his wife has been on oral contraceptives for at least 30 years He is retiring in November and desires vasectomy so she can stop oral contraceptives Denies prior history urologic problems including chronic scrotal pain, epididymitis or orchitis Prior hernia repair many years ago No history of bleeding or clotting disorders   PMH: Past Medical History:  Diagnosis Date   Basal cell carcinoma 02/01/2017   R temple approx 2.0 cm post to lat brow   Hypertension     Surgical History: Past Surgical History:  Procedure Laterality Date   COLONOSCOPY N/A 11/29/2015   Procedure: COLONOSCOPY;  Surgeon: Manya Silvas, MD;  Location: [facility];  Service: Endoscopy;  Laterality: N/A;   HERNIA REPAIR     SHOULDER SURGERY Right     Home Medications:  Allergies as of 08/29/2021   No Known Allergies      Medication List        Accurate as of August 29, 2021  1:09 PM. If you have any questions, ask your nurse or doctor.          albuterol 108 (90 Base) MCG/ACT inhaler Commonly known as: VENTOLIN HFA Inhale into the lungs.   aspirin EC 81 MG tablet Take 81 mg by mouth daily.   fluticasone 50 MCG/ACT nasal spray Commonly known as: FLONASE Place into the nose.   lisinopril-hydrochlorothiazide 10-12.5 MG tablet Commonly known as: ZESTORETIC TAKE 1 TABLET BY MOUTH DAILY FOR BLOOD PRESSURE AND FLUID   traMADol 50 MG tablet Commonly known as: ULTRAM TAKE 1 OR 2 TABLETS EVERY 6 HOURS AS NEEDED FOR PAIN   traZODone 50 MG tablet Commonly known as: DESYREL Take by mouth.        Allergies: No Known Allergies  Family  History: Family History  Problem Relation Age of Onset   Hematuria Neg Hx    Prostate cancer Neg Hx     Social History:  reports that he has never smoked. He has quit using smokeless tobacco.  His smokeless tobacco use included chew. He reports current alcohol use. No history on file for drug use.   Physical Exam: BP 123/76   Pulse 73   Ht '5\' 6"'$  (1.676 m)   Wt 180 lb (81.6 kg)   BMI 29.05 kg/m   Constitutional:  Alert and oriented, No acute distress. HEENT: [place] AT, moist mucus membranes.  Trachea midline, no masses. Cardiovascular: No clubbing, cyanosis, or edema. Respiratory: Normal respiratory effort, no increased work of breathing. GI: Abdomen is soft, nontender, nondistended, no abdominal masses GU: Phallus without lesions, testes descended bilaterally without masses or tenderness, spermatic cord/epididymis palpably normal bilaterally.  Vasa easily palpable bilaterally Skin: No rashes, bruises or suspicious lesions. Neurologic: Grossly intact, no focal deficits, moving all 4 extremities. Psychiatric: Normal mood and affect.   Assessment & Plan:    1.  Undesired fertility Desires to schedule vasectomy We had a long discussion about vasectomy. We specifically discussed the procedure, recovery and the risks, benefits and alternatives of vasectomy. I explained that the procedure entails removal of a segment of each vas deferens, each of which conducts sperm, and that the purpose of this  procedure is to cause sterility (inability to produce children or cause pregnancy). Vasectomy is intended to be permanent and irreversible form of contraception. Options for fertility after vasectomy include vasectomy reversal, or sperm retrieval with in vitro fertilization. These options are not always successful, and they may be expensive. We discussed reversible forms of birth control such as condoms, IUD or diaphragms, as well as the option of freezing sperm in a sperm bank prior to the vasectomy  procedure. We discussed the importance of avoiding strenuous exercise for four days after vasectomy, and the importance of refraining from any form of ejaculation for seven days after vasectomy. I explained that vasectomy does not produce immediate sterility so another form of contraceptive must be used until sterility is assured by having semen checked for sperm. Thus, a post vasectomy semen analysis is necessary to confirm sterility. Rarely, vasectomy must be repeated. We discussed the approximately 1 in 2,000 risk of pregnancy after vasectomy for men who have post-vasectomy semen analysis showing absent sperm or rare non-motile sperm. Typical side effects include a small amount of oozing blood, some discomfort and mild swelling in the area of incision.  Vasectomy does not affect sexual performance, function, please, sensation, interest, desire, satisfaction, penile erection, volume of semen or ejaculation. Other rare risks include allergy or adverse reaction to an anesthetic, testicular atrophy, hematoma, infection/abscess, prolonged tenderness of the vas deferens, pain, swelling, painful nodule or scar (called sperm granuloma) or epididymtis. We discussed chronic testicular pain syndrome. This has been reported to occur in as many as 1% of men and may be permanent. This can be treated with medication, small procedures or (rarely) surgery. He indicated he would call back if he desires a preprocedure anxiolytic and would need a driver if utilizing   [name], [place] [address] [place], [place] [address] [phone]

## 2021-08-31 ENCOUNTER — Encounter: Payer: Self-pay | Admitting: Urology

## 2021-09-22 ENCOUNTER — Ambulatory Visit (INDEPENDENT_AMBULATORY_CARE_PROVIDER_SITE_OTHER): Payer: 59 | Admitting: Dermatology

## 2021-09-22 ENCOUNTER — Other Ambulatory Visit: Payer: Self-pay

## 2021-09-22 DIAGNOSIS — I781 Nevus, non-neoplastic: Secondary | ICD-10-CM

## 2021-09-22 DIAGNOSIS — L578 Other skin changes due to chronic exposure to nonionizing radiation: Secondary | ICD-10-CM

## 2021-09-22 DIAGNOSIS — L570 Actinic keratosis: Secondary | ICD-10-CM | POA: Diagnosis not present

## 2021-09-22 NOTE — Patient Instructions (Signed)

## 2021-09-22 NOTE — Progress Notes (Signed)
   New Patient Visit  Subjective  Jorge Grant is a 60 y.o. male who presents for the following: Other (Spots of nose and temples).  The following portions of the chart were reviewed this encounter and updated as appropriate:   Tobacco  Allergies  Meds  Problems  Med Hx  Surg Hx  Fam Hx     Review of Systems:  No other skin or systemic complaints except as noted in HPI or Assessment and Plan.  Objective  Well appearing patient in no apparent distress; mood and affect are within normal limits.  A focused examination was performed including face, ears, scalp, hands, arms. Relevant physical exam findings are noted in the Assessment and Plan.  Left ear mid antihelix x 1, right ear x 2, right cheek x 1, nose x 2, left hand x 1 (7) Erythematous thin papules/macules with gritty scale.   Face Dilated blood vessels   Assessment & Plan   Actinic Damage - chronic, secondary to cumulative UV radiation exposure/sun exposure over time - diffuse scaly erythematous macules with underlying dyspigmentation - Recommend daily broad spectrum sunscreen SPF 30+ to sun-exposed areas, reapply every 2 hours as needed.  - Recommend staying in the shade or wearing long sleeves, sun glasses (UVA+UVB protection) and wide brim hats (4-inch brim around the entire circumference of the hat). - Call for new or changing lesions.  AK (actinic keratosis) (7) Left ear mid antihelix x 1, right ear x 2, right cheek x 1, nose x 2, left hand x 1  Destruction of lesion - Left ear mid antihelix x 1, right ear x 2, right cheek x 1, nose x 2, left hand x 1 Complexity: simple   Destruction method: cryotherapy   Informed consent: discussed and consent obtained   Timeout:  patient name, date of birth, surgical site, and procedure verified Lesion destroyed using liquid nitrogen: Yes   Region frozen until ice ball extended beyond lesion: Yes   Outcome: patient tolerated procedure well with no complications    Post-procedure details: wound care instructions given    Telangiectasia Face Benign May be due to actinic changes and rosacea. Discussed laser /BBL treatment option .  Patient may consider.  Return in about 4 months (around 01/23/2022) for AK follow up.  I, Ashok Cordia, CMA, am acting as scribe for Sarina Ser, MD . Documentation: I have reviewed the above documentation for accuracy and completeness, and I agree with the above.  Sarina Ser, MD

## 2021-09-23 ENCOUNTER — Encounter: Payer: Self-pay | Admitting: Dermatology

## 2021-10-17 ENCOUNTER — Ambulatory Visit: Payer: 59 | Admitting: Urology

## 2021-10-17 ENCOUNTER — Other Ambulatory Visit: Payer: Self-pay

## 2021-10-17 ENCOUNTER — Encounter: Payer: Self-pay | Admitting: Urology

## 2021-10-17 VITALS — BP 155/74 | HR 106 | Ht 66.0 in | Wt 180.0 lb

## 2021-10-17 DIAGNOSIS — Z302 Encounter for sterilization: Secondary | ICD-10-CM

## 2021-10-17 DIAGNOSIS — Z9852 Vasectomy status: Secondary | ICD-10-CM

## 2021-10-17 HISTORY — PX: VASECTOMY: SHX75

## 2021-10-17 MED ORDER — HYDROCODONE-ACETAMINOPHEN 5-325 MG PO TABS: 1.0000 | ORAL_TABLET | ORAL | 0 refills | Status: DC | PRN

## 2021-10-17 NOTE — Progress Notes (Signed)
Vasectomy Procedure Note  Indications: RANJIT ASHURST is a 60 y.o. male who presents today for elective sterilization.  He has been consented for the procedure.  He is aware of the risks and benefits.  He had no additional questions.  He agrees to proceed.  He denies any other significant change since his last visit.  Pre-operative Diagnosis: Elective sterilization  Post-operative Diagnosis: Elective sterilization  Premedication: None  Surgeon: Nicki Reaper C. Jaelyn Cloninger, M.D  Description: The patient was prepped and draped in the standard fashion.  The right vas deferens was identified and brought superiorly to the anterior scrotal skin.  The skin and vas was then anesthetized utilizing 8 ml 1% lidocaine.  A small stab incision was made and spread with the vas dissector.  The vas was grasped utilizing the vas clamp and elevated out of the incision.  The vas was dissected free from surrounding tissue and vessels and an ~1 cm segment was excised.  The vas lumens were cauterized utilizing electrocautery.  The distal segment was buried in the surrounding sheath with a 3-0 chromic suture.  No significant bleeding was observed.  The vas ends were then dropped back into the hemiscrotum.  The skin was closed with hemostatic pressure.  An identical procedure was performed on the contralateral side.  Clean dry gauze was applied to the incision sites.  The patient tolerated the procedure well.  Complications:None  Recommendations: 1.  No lifting greater than 10 pounds or strenuousactivity for 1 week. 2.  Scrotal support for 1 week. 3.  Shower only for 1 week; may shower in the morning 4.  May resume intercourse in one week if no significant discomfort.  5.  Continue alternate contraception for 12 weeks.  6.  Call for significant pain, swelling, redness or fever >100.5. 7.  Rx hydrocodone/APAP 5/325 1-2 every 6 hours prn pain. 8.  Follow-up semen analysis in 12 weeks.   John Giovanni, MD

## 2021-10-17 NOTE — Patient Instructions (Signed)

## 2021-10-18 ENCOUNTER — Encounter: Payer: Self-pay | Admitting: Urology

## 2022-01-22 ENCOUNTER — Other Ambulatory Visit: Payer: 59

## 2022-01-26 ENCOUNTER — Ambulatory Visit (INDEPENDENT_AMBULATORY_CARE_PROVIDER_SITE_OTHER): Payer: Commercial Managed Care - PPO | Admitting: Dermatology

## 2022-01-26 ENCOUNTER — Encounter: Payer: Self-pay | Admitting: Dermatology

## 2022-01-26 ENCOUNTER — Other Ambulatory Visit: Payer: Self-pay

## 2022-01-26 DIAGNOSIS — L578 Other skin changes due to chronic exposure to nonionizing radiation: Secondary | ICD-10-CM | POA: Diagnosis not present

## 2022-01-26 DIAGNOSIS — L570 Actinic keratosis: Secondary | ICD-10-CM

## 2022-01-26 MED ORDER — FLUOROURACIL 5 % EX CREA: TOPICAL_CREAM | CUTANEOUS | 1 refills | Status: DC

## 2022-01-26 NOTE — Patient Instructions (Addendum)
Cryotherapy Aftercare  Wash gently with soap and water everyday.   Apply Vaseline and Band-Aid daily until healed.   Prior to procedure, discussed risks of blister formation, small wound, skin dyspigmentation, or rare scar following cryotherapy. Recommend Vaseline ointment to treated areas while healing.   Start 5-fluorouracil/calcipotriene cream twice a day for 7 days to affected areas including temples, cheeks, nose. Prescription sent to [facility]. Patient provided with contact information for pharmacy and advised the pharmacy will mail the prescription to their home. Patient provided with handout reviewing treatment course and side effects and advised to call or message [place] on MyChart with any concerns.  Start in mid March.  5-fluorouracil/calcipotriene cream is is a type of field treatment used to treat precancers, thin skin cancers, and areas of sun damage. Expected reaction includes irritation and mild inflammation potentially progressing to more severe inflammation including redness, scaling, crusting and open sores/erosions.  If too much irritation occurs, ensure application of only a thin layer and decrease frequency of use to achieve a tolerable level of inflammation. Recommend applying Vaseline ointment to open sores as needed.  Minimize sun exposure while under treatment. Recommend daily broad spectrum sunscreen SPF 30+ to sun-exposed areas, reapply every 2 hours as needed.      If You Need Anything After Your Visit  If you have any questions or concerns for your doctor, please call our main line at [phone] and press option 4 to reach your doctor's medical assistant. If no one answers, please leave a voicemail as directed and we will return your call as soon as possible. Messages left after 4 pm will be answered the following business day.   You may also send [place] a message via [place]. We typically respond to MyChart messages within 1-2 business days.  For prescription  refills, please ask your pharmacy to contact our office. Our fax number is [phone].  If you have an urgent issue when the clinic is closed that cannot wait until the next business day, you can page your doctor at the number below.    Please note that while we do our best to be available for urgent issues outside of office hours, we are not available 24/7.   If you have an urgent issue and are unable to reach [place], you may choose to seek medical care at your doctor's office, retail clinic, urgent care center, or emergency room.  If you have a medical emergency, please immediately call 911 or go to the emergency department.  Pager Numbers  - Dr. Nehemiah Massed: [phone]  - Dr. Laurence Ferrari: [phone]  - Dr. Nicole Kindred: [phone]  In the event of inclement weather, please call our main line at [phone] for an update on the status of any delays or closures.  Dermatology Medication Tips: Please keep the boxes that topical medications come in in order to help keep track of the instructions about where and how to use these. Pharmacies typically print the medication instructions only on the boxes and not directly on the medication tubes.   If your medication is too expensive, please contact our office at [phone] option 4 or send [place] a message through [place].   We are unable to tell what your co-pay for medications will be in advance as this is different depending on your insurance coverage. However, we may be able to find a substitute medication at lower cost or fill out paperwork to get insurance to cover a needed medication.   If a prior authorization is required to  get your medication covered by your insurance company, please allow [place] 1-2 business days to complete this process.  Drug prices often vary depending on where the prescription is filled and some pharmacies may offer cheaper prices.  The website www.goodrx.com contains coupons for medications through different pharmacies. The  prices here do not account for what the cost may be with help from insurance (it may be cheaper with your insurance), but the website can give you the price if you did not use any insurance.  - You can print the associated coupon and take it with your prescription to the pharmacy.  - You may also stop by our office during regular business hours and pick up a GoodRx coupon card.  - If you need your prescription sent electronically to a different pharmacy, notify our office through [facility] or by phone at [phone] option 4.     Si Usted Necesita Algo Despus de Su Visita  Tambin puede enviarnos un mensaje a travs de Pharmacist, community. Por lo general respondemos a los mensajes de MyChart en el transcurso de 1 a 2 das hbiles.  Para renovar recetas, por favor pida a su farmacia que se ponga en contacto con nuestra oficina. Harland Dingwall de fax es [place] [phone].  Si tiene un asunto urgente cuando la clnica est cerrada y que no puede esperar hasta el siguiente da hbil, puede llamar/localizar a su doctor(a) al nmero que aparece a continuacin.   Por favor, tenga en cuenta que aunque hacemos todo lo posible para estar disponibles para asuntos urgentes fuera del horario de [place], no estamos disponibles las 24 horas del da, los 7 das de la [place].   Si tiene un problema urgente y no puede comunicarse con nosotros, puede optar por buscar atencin mdica  en el consultorio de su doctor(a), en una clnica privada, en un centro de atencin urgente o en una sala de emergencias.  Si tiene Engineering geologist, por favor llame inmediatamente al 911 o vaya a la sala de emergencias.  Nmeros de bper  - Dr. Nehemiah Massed: [phone]  - Dra. Moye: [phone]  - Dra. Nicole Kindred: [phone]  En caso de inclemencias del [place], por favor llame a Johnsie Kindred principal al [phone] para una actualizacin sobre el [place] de cualquier retraso o cierre.  Consejos para la medicacin en  dermatologa: Por favor, guarde las cajas en las que vienen los medicamentos de uso tpico para ayudarle a seguir las instrucciones sobre dnde y cmo usarlos. Las farmacias generalmente imprimen las instrucciones del medicamento slo en las cajas y no directamente en los tubos del [place].   Si su medicamento es muy caro, por favor, pngase en contacto con Zigmund Daniel llamando al [phone] y presione la opcin 4 o envenos un mensaje a travs de Pharmacist, community.   No podemos decirle cul ser su copago por los medicamentos por adelantado ya que esto es diferente dependiendo de la cobertura de su seguro. Sin embargo, es posible que podamos encontrar un medicamento sustituto a Electrical engineer un formulario para que el seguro cubra el medicamento que se considera necesario.   Si se requiere una autorizacin previa para que su compaa de seguros [place] su medicamento, por favor permtanos de 1 a 2 das hbiles para completar este proceso.  Los precios de los medicamentos varan con frecuencia dependiendo del Environmental consultant de dnde se surte la receta y alguna farmacias pueden ofrecer precios ms baratos.  El sitio web www.goodrx.com tiene cupones para medicamentos de Airline pilot.  Los precios aqu no tienen en cuenta lo que podra costar con la ayuda del seguro (puede ser ms barato con su seguro), pero el sitio web puede darle el precio si no utiliz Research scientist (physical sciences).  - Puede imprimir el cupn correspondiente y llevarlo con su receta a la farmacia.  - Tambin puede pasar por nuestra oficina durante el horario de atencin regular y Charity fundraiser una tarjeta de cupones de GoodRx.  - Si necesita que su receta se enve electrnicamente a una farmacia diferente, informe a nuestra oficina a travs de MyChart de [place] o por telfono llamando al [phone] y presione la opcin 4.

## 2022-01-26 NOTE — Progress Notes (Signed)
Follow-Up Visit   Subjective  Jorge Grant is a 61 y.o. male who presents for the following: Actinic Keratosis (4 month recheck. Tx with LN2. Ears, right cheek, nose, left dorsal hand). The patient has spots, moles and lesions to be evaluated, some may be new or changing and the patient has concerns that these could be cancer.  The following portions of the chart were reviewed this encounter and updated as appropriate:  Tobacco   Allergies   Meds   Problems   Med Hx   Surg Hx   Fam Hx      Review of Systems: No other skin or systemic complaints except as noted in HPI or Assessment and Plan.  Objective  Well appearing patient in no apparent distress; mood and affect are within normal limits.  A focused examination was performed including face, ears, neck, hands. Relevant physical exam findings are noted in the Assessment and Plan.  right cheek x4, left temple x1 (5) Erythematous thin papules/macules with gritty scale.    Assessment & Plan  AK (actinic keratosis) (5) right cheek x4, left temple x1  Actinic keratoses are precancerous spots that appear secondary to cumulative UV radiation exposure/sun exposure over time. They are chronic with expected duration over 1 year. A portion of actinic keratoses will progress to squamous cell carcinoma of the skin. It is not possible to reliably predict which spots will progress to skin cancer and so treatment is recommended to prevent development of skin cancer.  Recommend daily broad spectrum sunscreen SPF 30+ to sun-exposed areas, reapply every 2 hours as needed.  Recommend staying in the shade or wearing long sleeves, sun glasses (UVA+UVB protection) and wide brim hats (4-inch brim around the entire circumference of the hat). Call for new or changing lesions.  Destruction of lesion - right cheek x4, left temple x1 Complexity: simple   Destruction method: cryotherapy   Informed consent: discussed and consent obtained   Timeout:  patient  name, date of birth, surgical site, and procedure verified Lesion destroyed using liquid nitrogen: Yes   Region frozen until ice ball extended beyond lesion: Yes   Outcome: patient tolerated procedure well with no complications   Post-procedure details: wound care instructions given    fluorouracil (EFUDEX) 5 % cream - right cheek x4, left temple x1 Apply twice daily to temples, cheeks and nose for 7 days. Start in mid March.  Actinic Damage - Severe, confluent actinic changes with pre-cancerous actinic keratoses  - Severe, chronic, not at goal, secondary to cumulative UV radiation exposure over time - diffuse scaly erythematous macules and papules with underlying dyspigmentation - Discussed Prescription "Field Treatment" for Severe, Chronic Confluent Actinic Changes with Pre-Cancerous Actinic Keratoses Field treatment involves treatment of an entire area of skin that has confluent Actinic Changes (Sun/ Ultraviolet light damage) and PreCancerous Actinic Keratoses by method of PhotoDynamic Therapy (PDT) and/or prescription Topical Chemotherapy agents such as 5-fluorouracil, 5-fluorouracil/calcipotriene, and/or imiquimod.  The purpose is to decrease the number of clinically evident and subclinical PreCancerous lesions to prevent progression to development of skin cancer by chemically destroying early precancer changes that may or may not be visible.  It has been shown to reduce the risk of developing skin cancer in the treated area. As a result of treatment, redness, scaling, crusting, and open sores may occur during treatment course. One or more than one of these methods may be used and may have to be used several times to control, suppress and eliminate the PreCancerous  changes. Discussed treatment course, expected reaction, and possible side effects. - Recommend daily broad spectrum sunscreen SPF 30+ to sun-exposed areas, reapply every 2 hours as needed.  - Staying in the shade or wearing long  sleeves, sun glasses (UVA+UVB protection) and wide brim hats (4-inch brim around the entire circumference of the hat) are also recommended. - Call for new or changing lesions.   Start 5-fluorouracil/calcipotriene cream twice a day for 7 days to affected areas including temples, cheeks, nose. Prescription sent to [facility]. Patient provided with contact information for pharmacy and advised the pharmacy will mail the prescription to their home. Patient provided with handout reviewing treatment course and side effects and advised to call or message [place] on MyChart with any concerns.   Return in about 6 months (around 07/26/2022) for AK Follow Up.  I, Emelia Salisbury, CMA, am acting as scribe for Sarina Ser, MD. Documentation: I have reviewed the above documentation for accuracy and completeness, and I agree with the above.  Sarina Ser, MD

## 2022-01-30 ENCOUNTER — Encounter: Payer: Self-pay | Admitting: Dermatology

## 2022-08-11 ENCOUNTER — Encounter: Payer: Self-pay | Admitting: Dermatology

## 2022-08-11 ENCOUNTER — Ambulatory Visit (INDEPENDENT_AMBULATORY_CARE_PROVIDER_SITE_OTHER): Payer: Commercial Managed Care - PPO | Admitting: Dermatology

## 2022-08-11 DIAGNOSIS — L821 Other seborrheic keratosis: Secondary | ICD-10-CM

## 2022-08-11 DIAGNOSIS — L570 Actinic keratosis: Secondary | ICD-10-CM

## 2022-08-11 DIAGNOSIS — L578 Other skin changes due to chronic exposure to nonionizing radiation: Secondary | ICD-10-CM | POA: Diagnosis not present

## 2022-08-11 NOTE — Patient Instructions (Addendum)
Cryotherapy Aftercare  Wash gently with soap and water everyday.   Apply Vaseline and Band-Aid daily until healed.     Due to recent changes in healthcare laws, you may see results of your pathology and/or laboratory studies on MyChart before the doctors have had a chance to review them. We understand that in some cases there may be results that are confusing or concerning to you. Please understand that not all results are received at the same time and often the doctors may need to interpret multiple results in order to provide you with the best plan of care or course of treatment. Therefore, we ask that you please give us 2 business days to thoroughly review all your results before contacting the office for clarification. Should we see a critical lab result, you will be contacted sooner.   If You Need Anything After Your Visit  If you have any questions or concerns for your doctor, please call our main line at [phone] and press option 4 to reach your doctor's medical assistant. If no one answers, please leave a voicemail as directed and we will return your call as soon as possible. Messages left after 4 pm will be answered the following business day.   You may also send us a message via MyChart. We typically respond to MyChart messages within 1-2 business days.  For prescription refills, please ask your pharmacy to contact our office. Our fax number is [phone].  If you have an urgent issue when the clinic is closed that cannot wait until the next business day, you can page your doctor at the number below.    Please note that while we do our best to be available for urgent issues outside of office hours, we are not available 24/7.   If you have an urgent issue and are unable to reach us, you may choose to seek medical care at your doctor's office, retail clinic, urgent care center, or emergency room.  If you have a medical emergency, please immediately call 911 or go to the  emergency department.  Pager Numbers  - Dr. Kowalski: [phone]  - Dr. Moye: [phone]  - Dr. Stewart: [phone]  In the event of inclement weather, please call our main line at [phone] for an update on the status of any delays or closures.  Dermatology Medication Tips: Please keep the boxes that topical medications come in in order to help keep track of the instructions about where and how to use these. Pharmacies typically print the medication instructions only on the boxes and not directly on the medication tubes.   If your medication is too expensive, please contact our office at [phone] option 4 or send us a message through MyChart.   We are unable to tell what your co-pay for medications will be in advance as this is different depending on your insurance coverage. However, we may be able to find a substitute medication at lower cost or fill out paperwork to get insurance to cover a needed medication.   If a prior authorization is required to get your medication covered by your insurance company, please allow us 1-2 business days to complete this process.  Drug prices often vary depending on where the prescription is filled and some pharmacies may offer cheaper prices.  The website www.goodrx.com contains coupons for medications through different pharmacies. The prices here do not account for what the cost may be with help from insurance (it may be cheaper with your insurance), but the website can   give you the price if you did not use any insurance.  - You can print the associated coupon and take it with your prescription to the pharmacy.  - You may also stop by our office during regular business hours and pick up a GoodRx coupon card.  - If you need your prescription sent electronically to a different pharmacy, notify our office through [facility] MyChart or by phone at [phone] option 4.     Si Usted Necesita Algo Despus de Su Visita  Tambin puede  enviarnos un mensaje a travs de MyChart. Por lo general respondemos a los mensajes de MyChart en el transcurso de 1 a 2 das hbiles.  Para renovar recetas, por favor pida a su farmacia que se ponga en contacto con nuestra oficina. Nuestro nmero de fax es el [phone].  Si tiene un asunto urgente cuando la clnica est cerrada y que no puede esperar hasta el siguiente da hbil, puede llamar/localizar a su doctor(a) al nmero que aparece a continuacin.   Por favor, tenga en cuenta que aunque hacemos todo lo posible para estar disponibles para asuntos urgentes fuera del horario de oficina, no estamos disponibles las 24 horas del da, los 7 das de la semana.   Si tiene un problema urgente y no puede comunicarse con nosotros, puede optar por buscar atencin mdica  en el consultorio de su doctor(a), en una clnica privada, en un centro de atencin urgente o en una sala de emergencias.  Si tiene una emergencia mdica, por favor llame inmediatamente al 911 o vaya a la sala de emergencias.  Nmeros de bper  - Dr. Kowalski: [phone]  - Dra. Moye: [phone]  - Dra. Stewart: [phone]  En caso de inclemencias del tiempo, por favor llame a nuestra lnea principal al [phone] para una actualizacin sobre el estado de cualquier retraso o cierre.  Consejos para la medicacin en dermatologa: Por favor, guarde las cajas en las que vienen los medicamentos de uso tpico para ayudarle a seguir las instrucciones sobre dnde y cmo usarlos. Las farmacias generalmente imprimen las instrucciones del medicamento slo en las cajas y no directamente en los tubos del medicamento.   Si su medicamento es muy caro, por favor, pngase en contacto con nuestra oficina llamando al [phone] y presione la opcin 4 o envenos un mensaje a travs de MyChart.   No podemos decirle cul ser su copago por los medicamentos por adelantado ya que esto es diferente dependiendo de la cobertura de su seguro.  Sin embargo, es posible que podamos encontrar un medicamento sustituto a menor costo o llenar un formulario para que el seguro cubra el medicamento que se considera necesario.   Si se requiere una autorizacin previa para que su compaa de seguros cubra su medicamento, por favor permtanos de 1 a 2 das hbiles para completar este proceso.  Los precios de los medicamentos varan con frecuencia dependiendo del lugar de dnde se surte la receta y alguna farmacias pueden ofrecer precios ms baratos.  El sitio web www.goodrx.com tiene cupones para medicamentos de diferentes farmacias. Los precios aqu no tienen en cuenta lo que podra costar con la ayuda del seguro (puede ser ms barato con su seguro), pero el sitio web puede darle el precio si no utiliz ningn seguro.  - Puede imprimir el cupn correspondiente y llevarlo con su receta a la farmacia.  - Tambin puede pasar por nuestra oficina durante el horario de atencin regular y recoger una tarjeta de cupones de GoodRx.  -   Si necesita que su receta se enve electrnicamente a una farmacia diferente, informe a nuestra oficina a travs de MyChart de [place] o por telfono llamando al [phone] y presione la opcin 4.  

## 2022-08-11 NOTE — Progress Notes (Signed)
   Follow-Up Visit   Subjective  Jorge Grant is a 61 y.o. male who presents for the following: Actinic Keratosis (6 months f/u Aks on the face and scalp, treated with 5FU/Calcipotriene cream 5 months ago with a good response ). The patient has spots, moles and lesions to be evaluated, some may be new or changing and the patient has concerns that these could be cancer.  The following portions of the chart were reviewed this encounter and updated as appropriate:   Tobacco  Allergies  Meds  Problems  Med Hx  Surg Hx  Fam Hx     Review of Systems:  No other skin or systemic complaints except as noted in HPI or Assessment and Plan.  Objective  Well appearing patient in no apparent distress; mood and affect are within normal limits.  A focused examination was performed including face,scalp. Relevant physical exam findings are noted in the Assessment and Plan.  left temple x 3, right ear x 1  (4) (4) Erythematous thin papules/macules with gritty scale.    Assessment & Plan  AK (actinic keratosis) (4) left temple x 3, right ear x 1  (4)  Destruction of lesion - left temple x 3, right ear x 1  (4) Complexity: simple   Destruction method: cryotherapy   Informed consent: discussed and consent obtained   Timeout:  patient name, date of birth, surgical site, and procedure verified Lesion destroyed using liquid nitrogen: Yes   Region frozen until ice ball extended beyond lesion: Yes   Outcome: patient tolerated procedure well with no complications   Post-procedure details: wound care instructions given    Actinic Damage - chronic, secondary to cumulative UV radiation exposure/sun exposure over time - diffuse scaly erythematous macules with underlying dyspigmentation - Recommend daily broad spectrum sunscreen SPF 30+ to sun-exposed areas, reapply every 2 hours as needed.  - Recommend staying in the shade or wearing long sleeves, sun glasses (UVA+UVB protection) and wide brim hats  (4-inch brim around the entire circumference of the hat). - Call for new or changing lesions.   Seborrheic Keratoses - Stuck-on, waxy, tan-brown papules and/or plaques  - Benign-appearing - Discussed benign etiology and prognosis. - Observe - Call for any changes  Return in about 6 months (around 02/11/2023) for Aks .  IMarye Round, CMA, am acting as scribe for Sarina Ser, MD .  Documentation: I have reviewed the above documentation for accuracy and completeness, and I agree with the above.  Sarina Ser, MD

## 2022-08-13 ENCOUNTER — Other Ambulatory Visit: Payer: Self-pay | Admitting: Family Medicine

## 2022-08-13 DIAGNOSIS — E785 Hyperlipidemia, unspecified: Secondary | ICD-10-CM

## 2022-08-25 ENCOUNTER — Ambulatory Visit
Admission: RE | Admit: 2022-08-25 | Discharge: 2022-08-25 | Disposition: A | Discharge status: Home / Self Care | Payer: Commercial Managed Care - PPO | Source: Ambulatory Visit | Attending: Family Medicine | Admitting: Family Medicine

## 2022-08-25 DIAGNOSIS — E785 Hyperlipidemia, unspecified: Secondary | ICD-10-CM

## 2022-09-16 ENCOUNTER — Telehealth: Payer: Self-pay | Admitting: Cardiology

## 2022-09-16 NOTE — Telephone Encounter (Signed)
They are calling because they are unable to see the report reading from Dr. Garen Lah from the CT cardiac Scoring. Please advise

## 2022-09-16 NOTE — Telephone Encounter (Signed)
Returned call to Amy and left a detailed VM that Dr. Garen Lah is reading the study right now and it should be available to provider shortly.  Encouraged her to call back with any further questions or concerns.

## 2022-09-25 ENCOUNTER — Other Ambulatory Visit: Payer: Self-pay | Admitting: Family Medicine

## 2022-09-25 DIAGNOSIS — E785 Hyperlipidemia, unspecified: Secondary | ICD-10-CM

## 2022-10-12 ENCOUNTER — Ambulatory Visit
Admission: RE | Admit: 2022-10-12 | Discharge: 2022-10-12 | Disposition: A | Discharge status: Home / Self Care | Payer: Commercial Managed Care - PPO | Source: Ambulatory Visit | Attending: Family Medicine | Admitting: Family Medicine

## 2022-10-12 DIAGNOSIS — E785 Hyperlipidemia, unspecified: Secondary | ICD-10-CM | POA: Insufficient documentation

## 2022-10-13 ENCOUNTER — Telehealth: Payer: Self-pay | Admitting: Urology

## 2022-10-13 NOTE — Telephone Encounter (Signed)
Patient never had his 72-monthpostvasectomy semen sample checked.  Recommend rescheduling and let him know he is not considered sterile until this sample has been examined by the lab

## 2022-10-14 ENCOUNTER — Encounter: Payer: Self-pay | Admitting: Cardiovascular Disease

## 2022-10-14 NOTE — Progress Notes (Signed)
Cardiology Office Note:    Date:  10/16/2022   ID:  Jorge Grant, DOB [birth year], MRN [id number]  PCP:  Johna Roles, MD   [facility] Providers Cardiologist:  Jenaveve Fenstermaker    Referring MD: [place] Pink, MD   Chief Complaint  Patient presents with   Hypertension   coronary artery calcification    History of Present Illness:    Jorge Grant is a 61 y.o. male with a hx of HTN, HLD, borderline DM   Seen with wife , [name]   Dr. Gorden Harms ordered a coronary calcium score on the patient. Coronary calcium score of 911. This was 96th percentile for age and sex matched control.  Repeat CAC score :  Coronary calcium score of 1329. This was 98th percentile for age and sex matched control    + family hx   Father died at ag 45 of MI Brother had MI at age 4. Older brother had CABG at age 12.   No CP , Walks every day  - 1.2 miles twice a day ,  at a good pace   Non smoker Rare ETOH  Retired from Engineer, maintenance (IT) ) work ,  now does carpentry work   Lipid levels from [employer] reveal a  total cholesterol of 173  73 HDL is 40.1 LDL is 122 Triglyceride level 656     Past Medical History:  Diagnosis Date   AK (actinic keratosis)    Basal cell carcinoma 02/01/2017   R temple approx 2.0 cm post to lat brow   DDD (degenerative disc disease), lumbar    DM (diabetes mellitus) ([place])    Hyperlipidemia    Hypertension    Lumbar radiculitis    Lumbar radiculopathy    Nodular prostate     Past Surgical History:  Procedure Laterality Date   COLONOSCOPY N/A 11/29/2015   Procedure: COLONOSCOPY;  Surgeon: Manya Silvas, MD;  Location: [place];  Service: Endoscopy;  Laterality: N/A;   HERNIA REPAIR     SHOULDER SURGERY Right    VASECTOMY  10/17/2021    Current Medications: Current Meds  Medication Sig   aspirin EC 81 MG tablet Take 81 mg by mouth daily.   lisinopril-hydrochlorothiazide (PRINZIDE,ZESTORETIC) 10-12.5 MG tablet TAKE 1  TABLET BY MOUTH DAILY FOR BLOOD PRESSURE AND FLUID   rosuvastatin (CRESTOR) 20 MG tablet Take 1 tablet (20 mg total) by mouth daily.   traMADol (ULTRAM) 50 MG tablet Take 50 mg by mouth every 6 (six) hours as needed.   traZODone (DESYREL) 50 MG tablet Take by mouth.     Allergies:   Patient has no known allergies.   Social History   Socioeconomic History   Marital status: Married    Spouse name: Not on file   Number of children: Not on file   Years of education: Not on file   Highest education level: Not on file  Occupational History   Not on file  Tobacco Use   Smoking status: Never   Smokeless tobacco: Former    Types: Chew  Substance and Sexual Activity   Alcohol use: Yes   Drug use: Never   Sexual activity: Yes    Birth control/protection: Surgical    Comment: Vasectomy 10/17/21  Other Topics Concern   Not on file  Social History Narrative   Not on file   Social Determinants of Health   Financial Resource Strain: Not on file  Food Insecurity: Not on file  Transportation  Needs: Not on file  Physical Activity: Not on file  Stress: Not on file  Social Connections: Not on file     Family History: The patient's family history is negative for Hematuria and Prostate cancer.  ROS:   Please see the history of present illness.     All other systems reviewed and are negative.  EKGs/Labs/Other Studies Reviewed:    The following studies were reviewed today:   EKG:  Nov. 3, 2023:   NSR at 74,  NS ST abn.   Recent Labs: No results found for requested labs within last 365 days.  Recent Lipid Panel No results found for: "CHOL", "TRIG", "HDL", "CHOLHDL", "VLDL", "LDLCALC", "LDLDIRECT"   Risk Assessment/Calculations:      HYPERTENSION CONTROL Vitals:   10/16/22 1036 10/16/22 1115  BP: (!) 160/74 (!) 145/78    The patient's blood pressure is elevated above target today.  In order to address the patient's elevated BP: The blood pressure is usually elevated in  clinic.  Blood pressures monitored at home have been optimal.            Physical Exam:    VS:  BP (!) 145/78   Pulse 74   Ht '5\' 6"'$  (1.676 m)   Wt 181 lb 6.4 oz (82.3 kg)   SpO2 97%   BMI 29.28 kg/m     Wt Readings from Last 3 Encounters:  10/16/22 181 lb 6.4 oz (82.3 kg)  10/17/21 180 lb (81.6 kg)  08/29/21 180 lb (81.6 kg)     GEN:  Well nourished, well developed in no acute distress HEENT: Normal NECK: No JVD; No carotid bruits LYMPHATICS: No lymphadenopathy CARDIAC: RRR, no murmurs, rubs, gallops RESPIRATORY:  Clear to auscultation without rales, wheezing or rhonchi  ABDOMEN: Soft, non-tender, non-distended MUSCULOSKELETAL:  No edema; No deformity  SKIN: Warm and dry NEUROLOGIC:  Alert and oriented x 3 PSYCHIATRIC:  Normal affect   ASSESSMENT:    1. Elevated coronary artery calcium score   2. Mixed hyperlipidemia    PLAN:    In order of problems listed above:  Coronary artery calcifications: Jorge Grant presents for further evaluation and management of his hyperlipidemia and coronary artery calcifications.  His current coronary artery calcium score is 1300. His LDL is 122.  We will draw a lipoprotein (a) today.  Given the high coronary calcium score I would place his LDL goal at 55 or lower.  We will start him on rosuvastatin 20 mg a day.  I have encouraged him to let me know if he develops any symptoms of coronary ischemia while he is walking.  Currently he is walking 1.2 miles twice a day without any chest pain or shortness of breath.  He walks at a fairly good pace.  We discussed the symptoms of coronary ischemia and he will be on the look out for those.          Medication Adjustments/Labs and Tests Ordered: Current medicines are reviewed at length with the patient today.  Concerns regarding medicines are outlined above.  Orders Placed This Encounter  Procedures   Lipoprotein A (LPA)   EKG 12-Lead   Meds ordered this encounter  Medications    rosuvastatin (CRESTOR) 20 MG tablet    Sig: Take 1 tablet (20 mg total) by mouth daily.    Dispense:  90 tablet    Refill:  3     Patient Instructions  Medication Instructions:  Your physician has recommended you make the following change in  your medication: Start Rosuvastatin 20 mg by mouth daily  *If you need a refill on your cardiac medications before your next appointment, please call your pharmacy*   Lab Work: Your physician recommends that you return for lab work on day of appointment with Dr Madell Heino--01/22/23  If you have labs (blood work) drawn today and your tests are completely normal, you will receive your results only by: [place] (if you have MyChart) OR A paper copy in the mail If you have any lab test that is abnormal or we need to change your treatment, we will call you to review the results.   Testing/Procedures: none   Follow-Up: At [facility], you and your health needs are our priority.  As part of our continuing mission to provide you with exceptional heart care, we have created designated Provider Care Teams.  These Care Teams include your primary Cardiologist (physician) and Advanced Practice Providers (APPs -  Physician Assistants and Nurse Practitioners) who all work together to provide you with the care you need, when you need it.  We recommend signing up for the patient portal called "MyChart".  Sign up information is provided on this After Visit Summary.  MyChart is used to connect with patients for Virtual Visits (Telemedicine).  Patients are able to view lab/test results, encounter notes, upcoming appointments, etc.  Non-urgent messages can be sent to your provider as well.   To learn more about what you can do with MyChart, go to [email or web address].    Your next appointment:   3 month(s)--01/22/23 at 8 AM  The format for your next appointment:   In Person  Provider:   Mertie Moores, MD     Other Instructions    Important  Information About Sugar         Signed, Mertie Moores, MD  10/16/2022 2:24 PM    [place]

## 2022-10-14 NOTE — Telephone Encounter (Signed)
Patient bring semen sample and office appt to talk about ED

## 2022-10-16 ENCOUNTER — Encounter: Payer: Self-pay | Admitting: Cardiovascular Disease

## 2022-10-16 ENCOUNTER — Ambulatory Visit: Payer: Commercial Managed Care - PPO | Attending: Cardiology | Admitting: Cardiovascular Disease

## 2022-10-16 VITALS — BP 145/78 | HR 74 | Ht 66.0 in | Wt 181.4 lb

## 2022-10-16 DIAGNOSIS — R931 Abnormal findings on diagnostic imaging of heart and coronary circulation: Secondary | ICD-10-CM

## 2022-10-16 DIAGNOSIS — E782 Mixed hyperlipidemia: Secondary | ICD-10-CM

## 2022-10-16 DIAGNOSIS — E785 Hyperlipidemia, unspecified: Secondary | ICD-10-CM | POA: Insufficient documentation

## 2022-10-16 MED ORDER — ROSUVASTATIN CALCIUM 20 MG PO TABS: 20.0000 mg | ORAL_TABLET | Freq: Every day | ORAL | 3 refills | Status: DC

## 2022-10-16 NOTE — Patient Instructions (Signed)
Medication Instructions:  Your physician has recommended you make the following change in your medication: Start Rosuvastatin 20 mg by mouth daily  *If you need a refill on your cardiac medications before your next appointment, please call your pharmacy*   Lab Work: Your physician recommends that you return for lab work on day of appointment with Dr Nahser--01/22/23  If you have labs (blood work) drawn today and your tests are completely normal, you will receive your results only by: [place] (if you have MyChart) OR A paper copy in the mail If you have any lab test that is abnormal or we need to change your treatment, we will call you to review the results.   Testing/Procedures: none   Follow-Up: At [facility], you and your health needs are our priority.  As part of our continuing mission to provide you with exceptional heart care, we have created designated Provider Care Teams.  These Care Teams include your primary Cardiologist (physician) and Advanced Practice Providers (APPs -  Physician Assistants and Nurse Practitioners) who all work together to provide you with the care you need, when you need it.  We recommend signing up for the patient portal called "MyChart".  Sign up information is provided on this After Visit Summary.  MyChart is used to connect with patients for Virtual Visits (Telemedicine).  Patients are able to view lab/test results, encounter notes, upcoming appointments, etc.  Non-urgent messages can be sent to your provider as well.   To learn more about what you can do with MyChart, go to [email or web address].    Your next appointment:   3 month(s)--01/22/23 at 8 AM  The format for your next appointment:   In Person  Provider:   Mertie Moores, MD     Other Instructions    Important Information About Sugar

## 2022-10-19 LAB — LIPOPROTEIN A (LPA): Lipoprotein (a): 14.3 nmol/L (ref <75.0)

## 2022-10-28 ENCOUNTER — Encounter: Payer: Self-pay | Admitting: Urology

## 2022-10-28 ENCOUNTER — Ambulatory Visit (INDEPENDENT_AMBULATORY_CARE_PROVIDER_SITE_OTHER): Payer: Commercial Managed Care - PPO | Admitting: Urology

## 2022-10-28 VITALS — BP 159/76 | HR 81 | Ht 70.0 in | Wt 178.0 lb

## 2022-10-28 DIAGNOSIS — N486 Induration penis plastica: Secondary | ICD-10-CM

## 2022-10-28 DIAGNOSIS — N5201 Erectile dysfunction due to arterial insufficiency: Secondary | ICD-10-CM | POA: Diagnosis not present

## 2022-10-28 DIAGNOSIS — Z302 Encounter for sterilization: Secondary | ICD-10-CM

## 2022-10-28 MED ORDER — TADALAFIL 20 MG PO TABS: ORAL_TABLET | ORAL | 0 refills | Status: DC

## 2022-10-28 NOTE — Progress Notes (Signed)
10/28/2022 4:41 PM   Jorge Grant 30-Aug-1961 [id number]  Referring provider: Johna Roles, MD No address on file  Chief Complaint  Patient presents with   Erectile Dysfunction    HPI: 61 y.o. male who underwent vasectomy November 2022 and never brought in a postvasectomy semen sample.  When contacting about scheduling he elected to make an appointment to discuss ED.  Difficulty achieving and maintaining an erection for the past several months Organic risk factors include diabetes, hyperlipidemia, hypertension, antihypertensive medications and high coronary calcium score As noted dorsal penile curvature proximal shaft estimated at 15 degrees.  Mild pain with erection No oral/sublingual nitrate use   PMH: Past Medical History:  Diagnosis Date   AK (actinic keratosis)    Basal cell carcinoma 02/01/2017   R temple approx 2.0 cm post to lat brow   DDD (degenerative disc disease), lumbar    DM (diabetes mellitus) ([employer])    Hyperlipidemia    Hypertension    Lumbar radiculitis    Lumbar radiculopathy    Nodular prostate     Surgical History: Past Surgical History:  Procedure Laterality Date   COLONOSCOPY N/A 11/29/2015   Procedure: COLONOSCOPY;  Surgeon: Manya Silvas, MD;  Location: [place];  Service: Endoscopy;  Laterality: N/A;   HERNIA REPAIR     SHOULDER SURGERY Right    VASECTOMY  10/17/2021    Home Medications:  Allergies as of 10/28/2022   No Known Allergies      Medication List        Accurate as of October 28, 2022  4:41 PM. If you have any questions, ask your nurse or doctor.          aspirin EC 81 MG tablet Take 81 mg by mouth daily.   lisinopril-hydrochlorothiazide 10-12.5 MG tablet Commonly known as: ZESTORETIC TAKE 1 TABLET BY MOUTH DAILY FOR BLOOD PRESSURE AND FLUID   rosuvastatin 20 MG tablet Commonly known as: CRESTOR Take 1 tablet (20 mg total) by mouth daily.   tadalafil 20 MG tablet Commonly known as:  CIALIS Take 1 tab 1 hour prior to intercourse Started by: [name], MD   traMADol 50 MG tablet Commonly known as: ULTRAM Take 50 mg by mouth every 6 (six) hours as needed.   traZODone 50 MG tablet Commonly known as: DESYREL Take by mouth.        Allergies: No Known Allergies  Family History: Family History  Problem Relation Age of Onset   Hematuria Neg Hx    Prostate cancer Neg Hx     Social History:  reports that he has never smoked. He has quit using smokeless tobacco.  His smokeless tobacco use included chew. He reports current alcohol use. He reports that he does not use drugs.   Physical Exam: BP (!) 159/76   Pulse 81   Ht '5\' 10"'$  (1.778 m)   Wt 178 lb (80.7 kg)   BMI 25.54 kg/m   Constitutional:  Alert and oriented, No acute distress. HEENT: [place] AT Respiratory: Normal respiratory effort, no increased work of breathing. GI: Abdomen is soft, nontender, nondistended, no abdominal masses GU: Phallus proximal shaft dorsal penile plaque measuring >1 cm.  Testes descended bilaterally without masses or tenderness Psychiatric: Normal mood and affect.   Assessment & Plan:    1.  Erectile dysfunction Positive organic risk factors and we discussed that 70% of ED is related to vascular disease He may have an element of venoocclusive disease secondary to Peyronie's  He was interested in a PDE 5 inhibitor trial and Rx tadalafil 20 mg and pharmacy  2.  Peyronie's disease Acute phase Peyronie's disease Mild curvature at 15 degrees We discussed his pain will eventually resolve and curvature may improve, stabilize or progress  3.  Status post vasectomy He did bring in a semen sample today   [name], MD  [facility] [address], [place], [place] [address] [phone]

## 2022-10-29 LAB — POST-VAS SPERM EVALUATION,QUAL: Volume: 0.3 mL

## 2022-10-30 ENCOUNTER — Telehealth: Payer: Self-pay | Admitting: *Deleted

## 2022-10-30 NOTE — Telephone Encounter (Signed)
Notified patient as instructed, patient pleased °

## 2022-10-30 NOTE — Telephone Encounter (Signed)
-----   Message from [name], MD sent at 10/30/2022  7:47 AM EST ----- Semen analysis showed no sperm present. Jorge Grant

## 2022-11-01 ENCOUNTER — Encounter: Payer: Self-pay | Admitting: Urology

## 2022-11-30 ENCOUNTER — Ambulatory Visit: Payer: Commercial Managed Care - PPO | Admitting: Cardiology

## 2023-01-22 ENCOUNTER — Ambulatory Visit: Payer: Commercial Managed Care - PPO | Admitting: Cardiovascular Disease

## 2023-02-04 ENCOUNTER — Encounter: Payer: Self-pay | Admitting: Cardiovascular Disease

## 2023-02-04 NOTE — Progress Notes (Signed)
Cardiology Office Note:    Date:  02/05/2023   ID:  Jorge Grant, DOB January 03, 1961, MRN [id number]  PCP:  [place] [facility], [place] Providers Cardiologist:  Laurieann Friddle    Referring MD: Johna Roles, MD   Chief Complaint  Patient presents with   coronary calcification    History of Present Illness:    Jorge Grant is a 62 y.o. male with a hx of HTN, HLD, borderline DM   Seen with wife , Jorge Grant   Dr. Gorden Harms ordered a coronary calcium score on the patient. Coronary calcium score of 911. This was 96th percentile for age and sex matched control.  Repeat CAC score :  Coronary calcium score of 1329. This was 98th percentile for age and sex matched control    + family hx   Father died at ag 66 of MI Brother had MI at age 65. Older brother had CABG at age 58.   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 23, 2024  Jorge Grant is seen for follow up of his coronary artery calcifications LP(a) is 14.3.  He is doing well.  He is not having any chest pain or shortness of breath.  His blood pressure is mildly elevated.  Will increase his lisinopril to HCTZ 8 to 20 mg - 25 mg a day.  Will have him see Christen Bame, NP in 2 months and we will draw a basic metabolic profile, repeat lipids and ALT at that time.  Will have baseline lipids, ALT, basic metabolic profile today.  This will show [place] how well the rosuvastatin 20 mg tablet is working on his LDL.      Past Medical History:  Diagnosis Date   AK (actinic keratosis)    Basal cell carcinoma 02/01/2017   R temple approx 2.0 cm post to lat brow   DDD (degenerative disc disease), lumbar    DM (diabetes mellitus) ([place])    Hyperlipidemia    Hypertension     Lumbar radiculitis    Lumbar radiculopathy    Nodular prostate     Past Surgical History:  Procedure Laterality Date   COLONOSCOPY N/A 11/29/2015   Procedure: COLONOSCOPY;  Surgeon: Manya Silvas, MD;  Location: [place];  Service: Endoscopy;  Laterality: N/A;   HERNIA REPAIR     SHOULDER SURGERY Right    VASECTOMY  10/17/2021    Current Medications: Current Meds  Medication Sig   aspirin EC 81 MG tablet Take 81 mg by mouth daily.   lisinopril-hydrochlorothiazide (ZESTORETIC) 20-25 MG tablet Take 1 tablet by mouth daily.   rosuvastatin (CRESTOR) 20 MG tablet Take 1 tablet (20 mg total) by mouth daily.   tadalafil (CIALIS) 20 MG tablet Take 1 tab 1 hour prior to intercourse   traMADol (ULTRAM) 50 MG tablet Take 50 mg by mouth every 6 (six) hours as needed.   traZODone (DESYREL) 50 MG tablet Take by mouth.   [DISCONTINUED] lisinopril-hydrochlorothiazide (PRINZIDE,ZESTORETIC) 10-12.5 MG tablet TAKE 1 TABLET BY MOUTH DAILY FOR BLOOD PRESSURE AND FLUID     Allergies:   Patient has no known allergies.   Social History  Socioeconomic History   Marital status: Married    Spouse name: Not on file   Number of children: Not on file   Years of education: Not on file   Highest education level: Not on file  Occupational History   Not on file  Tobacco Use   Smoking status: Never   Smokeless tobacco: Former    Types: Chew  Substance and Sexual Activity   Alcohol use: Yes   Drug use: Never   Sexual activity: Yes    Birth control/protection: Surgical    Comment: Vasectomy 10/17/21  Other Topics Concern   Not on file  Social History Narrative   Not on file   Social Determinants of Health   Financial Resource Strain: Not on file  Food Insecurity: Not on file  Transportation Needs: Not on file  Physical Activity: Not on file  Stress: Not on file  Social Connections: Not on file     Family History: The patient's family history is negative for Hematuria and  Prostate cancer.  ROS:   Please see the history of present illness.     All other systems reviewed and are negative.  EKGs/Labs/Other Studies Reviewed:    The following studies were reviewed today:   EKG:   Recent Labs: No results found for requested labs within last 365 days.  Recent Lipid Panel No results found for: "CHOL", "TRIG", "HDL", "CHOLHDL", "VLDL", "LDLCALC", "LDLDIRECT"   Risk Assessment/Calculations:        Physical Exam:    Physical Exam: Blood pressure (!) 148/80, pulse 76, height '5\' 7"'$  (1.702 m), weight 184 lb 12.8 oz (83.8 kg), SpO2 97 %.  HYPERTENSION CONTROL Vitals:   02/05/23 0745 02/05/23 0811  BP: (!) 156/74 (!) 148/80    The patient's blood pressure is elevated above target today.  In order to address the patient's elevated BP: A current anti-hypertensive medication was adjusted today.       GEN:  Well nourished, well developed in no acute distress HEENT: Normal NECK: No JVD; No carotid bruits LYMPHATICS: No lymphadenopathy CARDIAC: RRR , no murmurs, rubs, gallops RESPIRATORY:  Clear to auscultation without rales, wheezing or rhonchi  ABDOMEN: Soft, non-tender, non-distended MUSCULOSKELETAL:  No edema; No deformity  SKIN: Warm and dry NEUROLOGIC:  Alert and oriented x 3    ASSESSMENT:    1. Mixed hyperlipidemia   2. Primary hypertension   3. Medication management     PLAN:      Coronary artery calcifications: Jorge Grant presents for further evaluation and management of his hyperlipidemia and coronary artery calcifications.  His current coronary artery calcium score is 1300. His LDL is 122.  His lipoprotein a is 14.  He has been on rosuvastatin 20 mg today.  Will check lipids and ALT today.  2.  Hypertension: Blood pressure remains a little elevated.  Will increase his lisinopril HCTZ to 20 mg - 25 mg a day.  Will have him see Christen Bame, NP in 2 months.  Will repeat his lipids, ALT and a basic metabolic profile at that  time.           Medication Adjustments/Labs and Tests Ordered: Current medicines are reviewed at length with the patient today.  Concerns regarding medicines are outlined above.  Orders Placed This Encounter  Procedures   Lipid panel   ALT   Basic metabolic panel   Lipid panel   ALT   Basic metabolic panel   Meds ordered this encounter  Medications   lisinopril-hydrochlorothiazide (ZESTORETIC)  20-25 MG tablet    Sig: Take 1 tablet by mouth daily.    Dispense:  90 tablet    Refill:  3    Dose INCREASE     Patient Instructions  Medication Instructions:  INCREASE Lisinopril/HCTZ to 20/'25mg'$  daily *If you need a refill on your cardiac medications before your next appointment, please call your pharmacy*   Lab Work: Lipids, ALT, BMET today BMET, Lipids, ALT in 2 months If you have labs (blood work) drawn today and your tests are completely normal, you will receive your results only by: [place] (if you have MyChart) OR A paper copy in the mail If you have any lab test that is abnormal or we need to change your treatment, we will call you to review the results.   Testing/Procedures: NONE   Follow-Up: At [facility], you and your health needs are our priority.  As part of our continuing mission to provide you with exceptional heart care, we have created designated Provider Care Teams.  These Care Teams include your primary Cardiologist (physician) and Advanced Practice Providers (APPs -  Physician Assistants and Nurse Practitioners) who all work together to provide you with the care you need, when you need it.  We recommend signing up for the patient portal called "MyChart".  Sign up information is provided on this After Visit Summary.  MyChart is used to connect with patients for Virtual Visits (Telemedicine).  Patients are able to view lab/test results, encounter notes, upcoming appointments, etc.  Non-urgent messages can be sent to your provider as well.    To learn more about what you can do with MyChart, go to [email or web address].    Your next appointment:   2 month(s)  Provider:   Christen Bame, NP     Signed, Mertie Moores, MD  02/05/2023 4:53 PM    [place]

## 2023-02-05 ENCOUNTER — Encounter: Payer: Self-pay | Admitting: Cardiovascular Disease

## 2023-02-05 ENCOUNTER — Ambulatory Visit: Payer: Commercial Managed Care - PPO | Attending: Cardiovascular Disease | Admitting: Cardiovascular Disease

## 2023-02-05 VITALS — BP 148/80 | HR 76 | Ht 67.0 in | Wt 184.8 lb

## 2023-02-05 DIAGNOSIS — E782 Mixed hyperlipidemia: Secondary | ICD-10-CM

## 2023-02-05 DIAGNOSIS — Z79899 Other long term (current) drug therapy: Secondary | ICD-10-CM | POA: Diagnosis not present

## 2023-02-05 DIAGNOSIS — I10 Essential (primary) hypertension: Secondary | ICD-10-CM | POA: Diagnosis not present

## 2023-02-05 LAB — LIPID PANEL
Chol/HDL Ratio: 2.3 ratio (ref 0.0–5.0)
Cholesterol, Total: 104 mg/dL (ref 100–199)
HDL: 46 mg/dL (ref >39)
LDL Chol Calc (NIH): 46 mg/dL (ref 0–99)
Triglycerides: 48 mg/dL (ref 0–149)
VLDL Cholesterol Cal: 12 mg/dL (ref 5–40)

## 2023-02-05 LAB — BASIC METABOLIC PANEL
BUN/Creatinine Ratio: 16 (ref 10–24)
BUN: 14 mg/dL (ref 8–27)
CO2: 25 mmol/L (ref 20–29)
Calcium: 9.9 mg/dL (ref 8.6–10.2)
Chloride: 102 mmol/L (ref 96–106)
Creatinine, Ser: 0.87 mg/dL (ref 0.76–1.27)
Glucose: 107 mg/dL — ABNORMAL HIGH (ref 70–99)
Potassium: 4.3 mmol/L (ref 3.5–5.2)
Sodium: 139 mmol/L (ref 134–144)
eGFR: 98 mL/min/{1.73_m2} (ref >59)

## 2023-02-05 LAB — ALT: ALT: 22 IU/L (ref 0–44)

## 2023-02-05 MED ORDER — LISINOPRIL-HYDROCHLOROTHIAZIDE 20-25 MG PO TABS: 1.0000 | ORAL_TABLET | Freq: Every day | ORAL | 3 refills | Status: DC

## 2023-02-05 NOTE — Patient Instructions (Addendum)
Medication Instructions:  INCREASE Lisinopril/HCTZ to 20/'25mg'$  daily *If you need a refill on your cardiac medications before your next appointment, please call your pharmacy*   Lab Work: Lipids, ALT, BMET today BMET, Lipids, ALT in 2 months If you have labs (blood work) drawn today and your tests are completely normal, you will receive your results only by: [place] (if you have MyChart) OR A paper copy in the mail If you have any lab test that is abnormal or we need to change your treatment, we will call you to review the results.   Testing/Procedures: NONE   Follow-Up: At [facility], you and your health needs are our priority.  As part of our continuing mission to provide you with exceptional heart care, we have created designated Provider Care Teams.  These Care Teams include your primary Cardiologist (physician) and Advanced Practice Providers (APPs -  Physician Assistants and Nurse Practitioners) who all work together to provide you with the care you need, when you need it.  We recommend signing up for the patient portal called "MyChart".  Sign up information is provided on this After Visit Summary.  MyChart is used to connect with patients for Virtual Visits (Telemedicine).  Patients are able to view lab/test results, encounter notes, upcoming appointments, etc.  Non-urgent messages can be sent to your provider as well.   To learn more about what you can do with MyChart, go to [email or web address].    Your next appointment:   2 month(s)  Provider:   Christen Bame, NP

## 2023-03-02 ENCOUNTER — Ambulatory Visit: Payer: Commercial Managed Care - PPO | Admitting: Dermatology

## 2023-04-08 ENCOUNTER — Ambulatory Visit: Payer: Commercial Managed Care - PPO | Admitting: Nurse Practitioner

## 2023-04-12 ENCOUNTER — Other Ambulatory Visit: Payer: Commercial Managed Care - PPO

## 2023-04-12 ENCOUNTER — Ambulatory Visit: Payer: Commercial Managed Care - PPO | Admitting: Nurse Practitioner

## 2023-04-12 NOTE — Progress Notes (Signed)
Cardiology Office Note:    Date:  04/16/2023   ID:  Jorge Grant, DOB 10/20/1961, MRN [id number]  PCP:  Jerl Mina, MD   [facility] Providers Cardiologist:  Kristeen Miss, MD     Referring MD: Jerl Mina, MD   Chief Complaint:   History of Present Illness:    Jorge Grant is a very pleasant 62 y.o. male with a hx of elevated coronary artery calcification, HTN, HLD, family history early CAD, borderline diabetes  Referred to cardiology and seen by Dr. Elease Hashimoto for elevated calcium score on 10/16/22.  Coronary calcium score ordered by PCP 09/2022 was 911, 96 percentile for age/sex matched control.  Repeat CAC score of 1329, 98th percentile for age/sex matched controls. Father died of MI at age 37, brother had MI at age 2, older brother had CABG at age 60. Is retired from [employer], now does carpentry work. Walks 1-2 miles twice daily at a good pace.  He was advised to start rosuvastatin 20 mg daily. Lp(a) was normal.   Seen in follow-up by Dr. Elease Hashimoto 02/05/23. LDL was 46. BP was mildly elevated and lisinopril was changed to lisinopril/HCTZ 20/25 mg daily. He was otherwise asymptomatic and was advised to return in 2 months for follow-up.  Today, he is here alone for follow-up.  He reports he is feeling great.  Walks 2 miles daily, all up hill on the way back home.  States he seems to have more energy than he had in the past. He denies chest pain, shortness of breath, lower extremity edema, fatigue, palpitations, melena, hematuria, hemoptysis, diaphoresis, weakness, presyncope, syncope, orthopnea, and PND. Cooks a lot at home, does not add salt, occasionally eats bacon and loves a good steak. Home BP has been well controlled. Has been taking aspirin for many years, no bleeding concerns.   Past Medical History:  Diagnosis Date   AK (actinic keratosis)    Basal cell carcinoma 02/01/2017   R temple approx 2.0 cm post to lat brow   DDD (degenerative disc disease), lumbar     DM (diabetes mellitus) ([facility])    Hyperlipidemia    Hypertension    Lumbar radiculitis    Lumbar radiculopathy    Nodular prostate     Past Surgical History:  Procedure Laterality Date   COLONOSCOPY N/A 11/29/2015   Procedure: COLONOSCOPY;  Surgeon: Scot Jun, MD;  Location: [facility] ENDOSCOPY;  Service: Endoscopy;  Laterality: N/A;   HERNIA REPAIR     SHOULDER SURGERY Right    VASECTOMY  10/17/2021    Current Medications: Current Meds  Medication Sig   aspirin EC 81 MG tablet Take 81 mg by mouth daily.   lisinopril-hydrochlorothiazide (ZESTORETIC) 20-25 MG tablet Take 1 tablet by mouth daily.   rosuvastatin (CRESTOR) 20 MG tablet Take 1 tablet (20 mg total) by mouth daily.   tadalafil (CIALIS) 20 MG tablet Take 1 tab 1 hour prior to intercourse   traMADol (ULTRAM) 50 MG tablet Take 50 mg by mouth every 6 (six) hours as needed.   traZODone (DESYREL) 50 MG tablet Take by mouth.     Allergies:   Patient has no known allergies.   Social History   Socioeconomic History   Marital status: Married    Spouse name: Not on file   Number of children: Not on file   Years of education: Not on file   Highest education level: Not on file  Occupational History   Not on file  Tobacco  Use   Smoking status: Never   Smokeless tobacco: Former    Types: Chew  Substance and Sexual Activity   Alcohol use: Yes   Drug use: Never   Sexual activity: Yes    Birth control/protection: Surgical    Comment: Vasectomy 10/17/21  Other Topics Concern   Not on file  Social History Narrative   Not on file   Social Determinants of Health   Financial Resource Strain: Not on file  Food Insecurity: Not on file  Transportation Needs: Not on file  Physical Activity: Not on file  Stress: Not on file  Social Connections: Not on file     Family History: The patient's family history is negative for Hematuria and Prostate cancer.  ROS:   Please see the history of present illness.   All other  systems reviewed and are negative.  Labs/Other Studies Reviewed:    The following studies were reviewed today:  CT Calcium Score 10/12/22 Non-cardiac: See separate report from [facility].   Ascending Aorta: Normal size   Pericardium: Normal   Coronary arteries: Normal origin of left and right coronary arteries. Distribution of arterial calcifications if present, as noted below;   LM 0   LAD 540   LCx 101   RCA 688   Total 1329   IMPRESSION AND RECOMMENDATION: 1. Coronary calcium score of 1329. This was 98th percentile for age and sex matched control.   2. CAC >300 in LAD, LCx, RCA. CAC-DRS A3/N3.   3. Recommend aspirin and statin if no contraindication.   4. Recommend cardiology consultation.   5. Continue heart healthy lifestyle and risk factor modification.   CT Calcium Score 09/16/22 1. Coronary calcium score of 911. This was 96th percentile for age and sex matched control.   2. CAC >300 in LAD, LCx, RCA   CAC-DRS A3/N3.   3. Recommend aspirin and statin if no contraindication.   4. Recommend cardiology consultation.   5. Continue heart healthy lifestyle and risk factor modification.    Recent Labs: 02/05/2023: ALT 22; BUN 14; Creatinine, Ser 0.87; Potassium 4.3; Sodium 139  Recent Lipid Panel    Component Value Date/Time   CHOL 104 02/05/2023 0834   TRIG 48 02/05/2023 0834   HDL 46 02/05/2023 0834   CHOLHDL 2.3 02/05/2023 0834   LDLCALC 46 02/05/2023 0834     Risk Assessment/Calculations:       Physical Exam:    VS:  BP 135/73   Pulse 80   Ht 5\' 7"  (1.702 m)   Wt 179 lb 6.4 oz (81.4 kg)   SpO2 98%   BMI 28.10 kg/m     Wt Readings from Last 3 Encounters:  04/16/23 179 lb 6.4 oz (81.4 kg)  02/05/23 184 lb 12.8 oz (83.8 kg)  10/28/22 178 lb (80.7 kg)     GEN:  Well nourished, well developed in no acute distress HEENT: Normal NECK: No JVD; No carotid bruits CARDIAC: RRR, no murmurs, rubs, gallops RESPIRATORY:  Clear to  auscultation without rales, wheezing or rhonchi  ABDOMEN: Soft, non-tender, non-distended MUSCULOSKELETAL:  No edema; No deformity. 2+ pedal pulses, equal bilaterally SKIN: Warm and dry NEUROLOGIC:  Alert and oriented x 3 PSYCHIATRIC:  Normal affect   EKG:  EKG is not ordered today.     Diagnoses:    1. Primary hypertension   2. Elevated coronary artery calcium score   3. Mixed hyperlipidemia    Assessment and Plan:     Coronary artery calcification: CAC  1329 (98th percentile) on CT calcium score 10/12/22. He is very active and denies chest pain, dyspnea, or other symptoms concerning for angina.  Walks daily for exercise. No indication for further ischemic evaluation at this time.  Encouraged secondary prevention including 150 minutes moderate intensity exercise each week along with heart healthy, mostly plant-based diet. Cbntinue good BP control. LDL is well controlled on statin therapy. No bleeding concerns on asa. Continue aspirin, statin.   Hypertension: BP is well controlled. Renal function stable on lab work from 01/2023. No medication changes today.   Hyperlipidemia LDL goal < 70: LDL 46 on 02/05/23. Scheduled for repeat FLP today. Feeling well on current therapy. Continue rosuvastatin.      Disposition: 6 months with me  Medication Adjustments/Labs and Tests Ordered: Current medicines are reviewed at length with the patient today.  Concerns regarding medicines are outlined above.  No orders of the defined types were placed in this encounter.  No orders of the defined types were placed in this encounter.   Patient Instructions  Medication Instructions:  Your physician recommends that you continue on your current medications as directed. Please refer to the Current Medication list given to you today.  *If you need a refill on your cardiac medications before your next appointment, please call your pharmacy*  Follow-Up: At [facility], you and your health needs  are our priority.  As part of our continuing mission to provide you with exceptional heart care, we have created designated Provider Care Teams.  These Care Teams include your primary Cardiologist (physician) and Advanced Practice Providers (APPs -  Physician Assistants and Nurse Practitioners) who all work together to provide you with the care you need, when you need it.   Your next appointment:   6 month(s)  Provider:   Eligha Bridegroom NP    Signed, Levi Aland, NP  04/16/2023 8:25 AM    [facility]

## 2023-04-16 ENCOUNTER — Encounter: Payer: Self-pay | Admitting: Nurse Practitioner

## 2023-04-16 ENCOUNTER — Ambulatory Visit: Payer: Commercial Managed Care - PPO | Attending: Cardiovascular Disease

## 2023-04-16 ENCOUNTER — Ambulatory Visit: Payer: Commercial Managed Care - PPO | Attending: Nurse Practitioner | Admitting: Nurse Practitioner

## 2023-04-16 VITALS — BP 135/73 | HR 80 | Ht 67.0 in | Wt 179.4 lb

## 2023-04-16 DIAGNOSIS — E782 Mixed hyperlipidemia: Secondary | ICD-10-CM

## 2023-04-16 DIAGNOSIS — R931 Abnormal findings on diagnostic imaging of heart and coronary circulation: Secondary | ICD-10-CM | POA: Diagnosis not present

## 2023-04-16 DIAGNOSIS — I10 Essential (primary) hypertension: Secondary | ICD-10-CM

## 2023-04-16 DIAGNOSIS — Z79899 Other long term (current) drug therapy: Secondary | ICD-10-CM

## 2023-04-16 LAB — BASIC METABOLIC PANEL
BUN/Creatinine Ratio: 20 (ref 10–24)
BUN: 18 mg/dL (ref 8–27)
CO2: 23 mmol/L (ref 20–29)
Calcium: 9.5 mg/dL (ref 8.6–10.2)
Chloride: 99 mmol/L (ref 96–106)
Creatinine, Ser: 0.88 mg/dL (ref 0.76–1.27)
Glucose: 123 mg/dL — ABNORMAL HIGH (ref 70–99)
Potassium: 4.4 mmol/L (ref 3.5–5.2)
Sodium: 137 mmol/L (ref 134–144)
eGFR: 98 mL/min/{1.73_m2} (ref >59)

## 2023-04-16 LAB — LIPID PANEL
Chol/HDL Ratio: 2.8 ratio (ref 0.0–5.0)
Cholesterol, Total: 124 mg/dL (ref 100–199)
HDL: 45 mg/dL (ref >39)
LDL Chol Calc (NIH): 66 mg/dL (ref 0–99)
Triglycerides: 62 mg/dL (ref 0–149)
VLDL Cholesterol Cal: 13 mg/dL (ref 5–40)

## 2023-04-16 LAB — ALT: ALT: 23 IU/L (ref 0–44)

## 2023-04-16 NOTE — Patient Instructions (Signed)
Medication Instructions:  Your physician recommends that you continue on your current medications as directed. Please refer to the Current Medication list given to you today.  *If you need a refill on your cardiac medications before your next appointment, please call your pharmacy*  Follow-Up: At [facility], you and your health needs are our priority.  As part of our continuing mission to provide you with exceptional heart care, we have created designated Provider Care Teams.  These Care Teams include your primary Cardiologist (physician) and Advanced Practice Providers (APPs -  Physician Assistants and Nurse Practitioners) who all work together to provide you with the care you need, when you need it.   Your next appointment:   6 month(s)  Provider:   Eligha Bridegroom NP

## 2023-04-28 ENCOUNTER — Ambulatory Visit (INDEPENDENT_AMBULATORY_CARE_PROVIDER_SITE_OTHER): Payer: Commercial Managed Care - PPO | Admitting: Dermatology

## 2023-04-28 VITALS — BP 125/74

## 2023-04-28 DIAGNOSIS — L578 Other skin changes due to chronic exposure to nonionizing radiation: Secondary | ICD-10-CM | POA: Diagnosis not present

## 2023-04-28 DIAGNOSIS — L570 Actinic keratosis: Secondary | ICD-10-CM

## 2023-04-28 DIAGNOSIS — X32XXXA Exposure to sunlight, initial encounter: Secondary | ICD-10-CM

## 2023-04-28 DIAGNOSIS — L820 Inflamed seborrheic keratosis: Secondary | ICD-10-CM

## 2023-04-28 DIAGNOSIS — W908XXA Exposure to other nonionizing radiation, initial encounter: Secondary | ICD-10-CM | POA: Diagnosis not present

## 2023-04-28 NOTE — Patient Instructions (Signed)
Cryotherapy Aftercare  Wash gently with soap and water everyday.   Apply Vaseline and Band-Aid daily until healed.     Due to recent changes in healthcare laws, you may see results of your pathology and/or laboratory studies on MyChart before the doctors have had a chance to review them. We understand that in some cases there may be results that are confusing or concerning to you. Please understand that not all results are received at the same time and often the doctors may need to interpret multiple results in order to provide you with the best plan of care or course of treatment. Therefore, we ask that you please give us 2 business days to thoroughly review all your results before contacting the office for clarification. Should we see a critical lab result, you will be contacted sooner.   If You Need Anything After Your Visit  If you have any questions or concerns for your doctor, please call our main line at [phone] and press option 4 to reach your doctor's medical assistant. If no one answers, please leave a voicemail as directed and we will return your call as soon as possible. Messages left after 4 pm will be answered the following business day.   You may also send us a message via MyChart. We typically respond to MyChart messages within 1-2 business days.  For prescription refills, please ask your pharmacy to contact our office. Our fax number is [phone].  If you have an urgent issue when the clinic is closed that cannot wait until the next business day, you can page your doctor at the number below.    Please note that while we do our best to be available for urgent issues outside of office hours, we are not available 24/7.   If you have an urgent issue and are unable to reach us, you may choose to seek medical care at your doctor's office, retail clinic, urgent care center, or emergency room.  If you have a medical emergency, please immediately call 911 or go to the  emergency department.  Pager Numbers  - Dr. Kowalski: [phone]  - Dr. Moye: [phone]  - Dr. Stewart: [phone]  In the event of inclement weather, please call our main line at [phone] for an update on the status of any delays or closures.  Dermatology Medication Tips: Please keep the boxes that topical medications come in in order to help keep track of the instructions about where and how to use these. Pharmacies typically print the medication instructions only on the boxes and not directly on the medication tubes.   If your medication is too expensive, please contact our office at [phone] option 4 or send us a message through MyChart.   We are unable to tell what your co-pay for medications will be in advance as this is different depending on your insurance coverage. However, we may be able to find a substitute medication at lower cost or fill out paperwork to get insurance to cover a needed medication.   If a prior authorization is required to get your medication covered by your insurance company, please allow us 1-2 business days to complete this process.  Drug prices often vary depending on where the prescription is filled and some pharmacies may offer cheaper prices.  The website www.goodrx.com contains coupons for medications through different pharmacies. The prices here do not account for what the cost may be with help from insurance (it may be cheaper with your insurance), but the website can   give you the price if you did not use any insurance.  - You can print the associated coupon and take it with your prescription to the pharmacy.  - You may also stop by our office during regular business hours and pick up a GoodRx coupon card.  - If you need your prescription sent electronically to a different pharmacy, notify our office through [place] MyChart or by phone at [phone] option 4.     Si Usted Necesita Algo Despus de Su Visita  Tambin puede  enviarnos un mensaje a travs de MyChart. Por lo general respondemos a los mensajes de MyChart en el transcurso de 1 a 2 das hbiles.  Para renovar recetas, por favor pida a su farmacia que se ponga en contacto con nuestra oficina. Nuestro nmero de fax es el [phone].  Si tiene un asunto urgente cuando la clnica est cerrada y que no puede esperar hasta el siguiente da hbil, puede llamar/localizar a su doctor(a) al nmero que aparece a continuacin.   Por favor, tenga en cuenta que aunque hacemos todo lo posible para estar disponibles para asuntos urgentes fuera del horario de oficina, no estamos disponibles las 24 horas del da, los 7 das de la semana.   Si tiene un problema urgente y no puede comunicarse con nosotros, puede optar por buscar atencin mdica  en el consultorio de su doctor(a), en una clnica privada, en un centro de atencin urgente o en una sala de emergencias.  Si tiene una emergencia mdica, por favor llame inmediatamente al 911 o vaya a la sala de emergencias.  Nmeros de bper  - Dr. Kowalski: [phone]  - Dra. Moye: [phone]  - Dra. Stewart: [phone]  En caso de inclemencias del tiempo, por favor llame a nuestra lnea principal al [phone] para una actualizacin sobre el estado de cualquier retraso o cierre.  Consejos para la medicacin en dermatologa: Por favor, guarde las cajas en las que vienen los medicamentos de uso tpico para ayudarle a seguir las instrucciones sobre dnde y cmo usarlos. Las farmacias generalmente imprimen las instrucciones del medicamento slo en las cajas y no directamente en los tubos del medicamento.   Si su medicamento es muy caro, por favor, pngase en contacto con nuestra oficina llamando al [phone] y presione la opcin 4 o envenos un mensaje a travs de MyChart.   No podemos decirle cul ser su copago por los medicamentos por adelantado ya que esto es diferente dependiendo de la cobertura de su seguro.  Sin embargo, es posible que podamos encontrar un medicamento sustituto a menor costo o llenar un formulario para que el seguro cubra el medicamento que se considera necesario.   Si se requiere una autorizacin previa para que su compaa de seguros cubra su medicamento, por favor permtanos de 1 a 2 das hbiles para completar este proceso.  Los precios de los medicamentos varan con frecuencia dependiendo del lugar de dnde se surte la receta y alguna farmacias pueden ofrecer precios ms baratos.  El sitio web www.goodrx.com tiene cupones para medicamentos de diferentes farmacias. Los precios aqu no tienen en cuenta lo que podra costar con la ayuda del seguro (puede ser ms barato con su seguro), pero el sitio web puede darle el precio si no utiliz ningn seguro.  - Puede imprimir el cupn correspondiente y llevarlo con su receta a la farmacia.  - Tambin puede pasar por nuestra oficina durante el horario de atencin regular y recoger una tarjeta de cupones de GoodRx.  -   Si necesita que su receta se enve electrnicamente a una farmacia diferente, informe a nuestra oficina a travs de MyChart de [place] o por telfono llamando al [phone] y presione la opcin 4.  

## 2023-04-28 NOTE — Progress Notes (Signed)
   Follow-Up Visit   Subjective  Jorge Grant is a 62 y.o. male who presents for the following: AK follow up of left temple and right ear treated with LN2 The patient has spots, moles and lesions to be evaluated, some may be new or changing and the patient may have concern these could be cancer.  The following portions of the chart were reviewed this encounter and updated as appropriate: medications, allergies, medical history  Review of Systems:  No other skin or systemic complaints except as noted in HPI or Assessment and Plan.  Objective  Well appearing patient in no apparent distress; mood and affect are within normal limits. A focused examination was performed of the following areas: Face, neck Relevant exam findings are noted in the Assessment and Plan.  Face, neck, ears (9) Erythematous thin papules/macules with gritty scale.   Right Forearm - Posterior Erythematous stuck-on, waxy papule or plaque   Assessment & Plan   ACTINIC DAMAGE - chronic, secondary to cumulative UV radiation exposure/sun exposure over time - diffuse scaly erythematous macules with underlying dyspigmentation - Recommend daily broad spectrum sunscreen SPF 30+ to sun-exposed areas, reapply every 2 hours as needed.  - Recommend staying in the shade or wearing long sleeves, sun glasses (UVA+UVB protection) and wide brim hats (4-inch brim around the entire circumference of the hat). - Call for new or changing lesions.  AK (actinic keratosis) (9) Face, neck, ears  Destruction of lesion - Face, neck, ears Complexity: simple   Destruction method: cryotherapy   Informed consent: discussed and consent obtained   Timeout:  patient name, date of birth, surgical site, and procedure verified Lesion destroyed using liquid nitrogen: Yes   Region frozen until ice ball extended beyond lesion: Yes   Outcome: patient tolerated procedure well with no complications   Post-procedure details: wound care instructions  given    Inflamed seborrheic keratosis Right Forearm - Posterior  Destruction of lesion - Right Forearm - Posterior Complexity: simple   Destruction method: cryotherapy   Informed consent: discussed and consent obtained   Timeout:  patient name, date of birth, surgical site, and procedure verified Lesion destroyed using liquid nitrogen: Yes   Region frozen until ice ball extended beyond lesion: Yes   Outcome: patient tolerated procedure well with no complications   Post-procedure details: wound care instructions given     Return in about 6 months (around 10/29/2023) for AK follow up.  I, Joanie Coddington, CMA, am acting as scribe for Armida Sans, MD .  Documentation: I have reviewed the above documentation for accuracy and completeness, and I agree with the above.  Armida Sans, MD

## 2023-05-12 ENCOUNTER — Encounter: Payer: Self-pay | Admitting: Dermatology

## 2023-09-03 ENCOUNTER — Ambulatory Visit: Payer: Commercial Managed Care - PPO

## 2023-09-03 DIAGNOSIS — K640 First degree hemorrhoids: Secondary | ICD-10-CM | POA: Diagnosis not present

## 2023-09-03 DIAGNOSIS — Z86010 Personal history of colonic polyps: Secondary | ICD-10-CM | POA: Diagnosis not present

## 2023-09-03 DIAGNOSIS — D123 Benign neoplasm of transverse colon: Secondary | ICD-10-CM | POA: Diagnosis not present

## 2023-09-03 DIAGNOSIS — Z1211 Encounter for screening for malignant neoplasm of colon: Secondary | ICD-10-CM | POA: Diagnosis present

## 2023-10-26 NOTE — Progress Notes (Unsigned)
Cardiology Office Note:  .   Date:  10/28/2023  ID:  Jorge Grant, DOB 1961-07-19, MRN [id number] PCP: Jerl Mina, MD  [facility] Providers Cardiologist:  Kristeen Miss, MD    Patient Profile: .      PMH Coronary artery calcification Hypertension Hyperlipidemia Family history early CAD Borderline diabetes  Referred to cardiology and seen by Dr. Elease Hashimoto for elevated calcium score 11/12/2022.  Coronary calcium score ordered by PCP 09/2022 was 911 (96 percentile).  Repeat CAC score of 1329, 98th percentile.  He has significant family history with father who died from MI at age 14, brother had MI at age 22, and another brother had CABG at age 46.  He is retired from [employer], now does carpentry work.  Walks 1 to 2 miles twice daily at a good pace.  LP(a) was normal.  He was started on rosuvastatin 20 mg daily.  At follow-up with Dr. Elease Hashimoto 02/05/2023, LDL was 46.  BP was mildly elevated and lisinopril was changed to lisinopril/HCTZ 20-25 mg daily.  He was asymptomatic at the time and was advised to return in 2 months for follow-up.  Last cardiology clinic visit was with me on 04/16/2023.  He reported feeling great, walking 2 miles daily all up hill on the way back home.  Reported more energy than he had in the past.  He was cooking most meals at home and not adding salt but does admit to occasionally eating bacon and steak.       History of Present Illness: .   Jorge Grant is a very pleasant 62 y.o. male who is here today for follow-up of CAD and hypertension. Reports home BP consistently in the mid-130s over 60s or 70s. Consistently takes anti-hypertensive around 5:30. He admits to a poor diet recently, especially after a hunting trip to [place], where he indulged in a lot of meat and sweet foods, leading to a weight gain of about 5 pounds. The patient acknowledges the need to improve his diet and lose weight. Weight has increased 10 lbs since May 2024. 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 Manages the pain with occasional use of Tramadol. Remains physically active, walking two miles every morning, most of it uphill. He denies chest pain, palpitations, shortness of breath, orthopnea, PND, edema.  He recently carried heavy loads up he will while tearing up a deck and did not have any concerning cardiac symptoms. Previously diagnosed with sleep apnea but he was > 200 lbs at that time and snoring improved with weight loss.   Discussed the use of AI scribe software for clinical note transcription with the patient, who gave verbal consent to proceed.   ROS: See HPI       Studies Reviewed: Marland Kitchen   EKG Interpretation Date/Time:  Thursday October 28 2023 07:51:25 EST Ventricular Rate:  77 PR Interval:  130 QRS Duration:  78 QT Interval:  356 QTC Calculation: 402 R Axis:   37  Text Interpretation: Normal sinus rhythm Normal ECG No ST abnormality Confirmed by Eligha Bridegroom [phone]) on 10/28/2023 8:00:39 AM    Risk Assessment/Calculations:             Physical Exam:   VS:  BP 138/64   Pulse 77   Ht 5\' 7"  (1.702 m)   Wt 189 lb (85.7 kg)   SpO2 99%   BMI 29.60 kg/m    Wt Readings from Last 3 Encounters:  10/28/23 189 lb (85.7 kg)  04/16/23 179 lb 6.4 oz (81.4 kg)  02/05/23 184 lb 12.8 oz (83.8 kg)    GEN: Well nourished, well developed in no acute distress NECK: No JVD; No carotid bruits CARDIAC: RRR, no murmurs, rubs, gallops RESPIRATORY:  Clear to auscultation without rales, wheezing or rhonchi  ABDOMEN: Soft, non-tender, non-distended EXTREMITIES:  No edema; No deformity     ASSESSMENT AND PLAN: .    CAD without angina: Elevated coronary artery calcium score 1329 (98th percentile) on CT with > 300 in LAD, LCx, RCA. He is physically active and denies chest pain, dyspnea, or other symptoms concerning for angina.  No indication for further ischemic evaluation at this time. LDL is at  goal. Focus on secondary prevention including heart healthy mostly plant based diet avoiding saturated fat, processed foods, simple carbohydrates, and sugar along with aiming for at least 150 minutes of moderate intensity exercise each week. No bleeding concerns.  Continue aspirin, Zestoretic, and Crestor.  Hyperlipidemia LDL goal < 70: Lipid panel 08/26/23 with total cholesterol 115, triglycerides 64, HDL 44.4, and LDL-C 58.  He is tolerating Crestor without any concerning side effects. Admits he needs to work on better diet, he has been eating more sweets recently and A1C is up to 6.5%. Heart healthy diet and 150 minute moderate intensity exercise each week encouraged. Continue Crestor.   Pre-diabetes: A1C 6.5% on 08/26/23.  This is up from the previous year.  We discussed the meaning of this and I encouraged lifestyle management including dietary changes and increased exercise can lower blood sugar.   Hypertension: BP is mildly elevated this morning. Home BP consistently > 135 mmHg systolic recently. He admits to taking cold medicine recently with decongestant. I have asked him to monitor for 2 weeks at home and send readings. He would prefer not to add additional anti-hypertensive unless necessary.         Dispo: 1 year with me  Signed, Eligha Bridegroom, NP-C

## 2023-10-28 ENCOUNTER — Ambulatory Visit: Payer: Commercial Managed Care - PPO | Attending: Nurse Practitioner | Admitting: Nurse Practitioner

## 2023-10-28 ENCOUNTER — Encounter: Payer: Self-pay | Admitting: Nurse Practitioner

## 2023-10-28 VITALS — BP 138/64 | HR 77 | Ht 67.0 in | Wt 189.0 lb

## 2023-10-28 DIAGNOSIS — I2510 Atherosclerotic heart disease of native coronary artery without angina pectoris: Secondary | ICD-10-CM

## 2023-10-28 DIAGNOSIS — E785 Hyperlipidemia, unspecified: Secondary | ICD-10-CM

## 2023-10-28 DIAGNOSIS — I10 Essential (primary) hypertension: Secondary | ICD-10-CM | POA: Diagnosis not present

## 2023-10-28 DIAGNOSIS — R931 Abnormal findings on diagnostic imaging of heart and coronary circulation: Secondary | ICD-10-CM | POA: Diagnosis not present

## 2023-10-28 MED ORDER — ROSUVASTATIN CALCIUM 20 MG PO TABS: 20.0000 mg | ORAL_TABLET | Freq: Every day | ORAL | 3 refills | Status: DC

## 2023-10-28 NOTE — Patient Instructions (Signed)
Medication Instructions:   Your physician recommends that you continue on your current medications as directed. Please refer to the Current Medication list given to you today.   *If you need a refill on your cardiac medications before your next appointment, please call your pharmacy*   Lab Work:  None ordered.  If you have labs (blood work) drawn today and your tests are completely normal, you will receive your results only by: MyChart Message (if you have MyChart) OR A paper copy in the mail If you have any lab test that is abnormal or we need to change your treatment, we will call you to review the results.   Testing/Procedures:  None ordered.   Follow-Up: At [facility], you and your health needs are our priority.  As part of our continuing mission to provide you with exceptional heart care, we have created designated Provider Care Teams.  These Care Teams include your primary Cardiologist (physician) and Advanced Practice Providers (APPs -  Physician Assistants and Nurse Practitioners) who all work together to provide you with the care you need, when you need it.  We recommend signing up for the patient portal called "MyChart".  Sign up information is provided on this After Visit Summary.  MyChart is used to connect with patients for Virtual Visits (Telemedicine).  Patients are able to view lab/test results, encounter notes, upcoming appointments, etc.  Non-urgent messages can be sent to your provider as well.   To learn more about what you can do with MyChart, go to [email or web address].    Your next appointment:   1 year(s)  Provider:   Eligha Bridegroom, NP         Other Instructions  Your physician wants you to follow-up in: 1 year. You will receive a reminder letter in the mail two months in advance. If you don't receive a letter, please call our office to schedule the follow-up appointment.  HOW TO TAKE YOUR BLOOD PRESSURE  Rest 5 minutes before taking  your blood pressure. Don't  smoke or drink caffeinated beverages for at least 30 minutes before. Take your blood pressure before (not after) you eat. Sit comfortably with your back supported and both feet on the floor ( don't cross your legs). Elevate your arm to heart level on a table or a desk. Use the proper sized cuff.  It should fit smoothly and snugly around your bare upper arm.  There should be  Enough room to slip a fingertip under the cuff.  The bottom edge of the cuff should be 1 inch above the crease Of the elbow. Please monitor your blood pressure once daily 2 hours after your am medication. If you blood pressure Consistently remains above 130 (systolic) top number or over 80 ( diastolic) bottom number X 3 days  Consecutively.  Please call our office at [phone] or send Mychart message to [place] in two weeks.    ----Avoid cold medicines with D or DM at the end of them----  Adopting a Healthy Lifestyle.   Weight: Know what a healthy weight is for you (roughly BMI <25) and aim to maintain this. You can calculate your body mass index on your smart phone. Unfortunately, this is not the most accurate measure of healthy weight, but it is the simplest measurement to use. A more accurate measurement involves body scanning which measures lean muscle, fat tissue and bony density. We do not have this equipment at [facility].    Diet: Aim for 7+ servings of fruits  and vegetables daily Limit animal fats in diet for cholesterol and heart health - choose grass fed whenever available Avoid highly processed foods (fast food burgers, tacos, fried chicken, pizza, hot dogs, french fries)  Saturated fat comes in the form of butter, lard, coconut oil, margarine, partially hydrogenated oils, and fat in meat. These increase your risk of cardiovascular disease.  Use healthy plant oils, such as olive, canola, soy, corn, sunflower and peanut.  Whole foods such as fruits, vegetables and whole grains  have fiber  Men need > 38 grams of fiber per day Women need > 25 grams of fiber per day  Load up on vegetables and fruits - one-half of your plate: Aim for color and variety, and remember that potatoes dont count. Go for whole grains - one-quarter of your plate: Whole wheat, barley, wheat berries, quinoa, oats, brown rice, and foods made with them. If you want pasta, go with whole wheat pasta. Protein power - one-quarter of your plate: Fish, chicken, beans, and nuts are all healthy, versatile protein sources. Limit red meat. You need carbohydrates for energy! The type of carbohydrate is more important than the amount. Choose carbohydrates such as vegetables, fruits, whole grains, beans, and nuts in the place of white rice, white pasta, potatoes (baked or fried), macaroni and cheese, cakes, cookies, and donuts.  If youre thirsty, drink water. Coffee and tea are good in moderation, but skip sugary drinks and limit milk and dairy products to one or two daily servings. Keep sugar intake at 6 teaspoons or 24 grams or LESS       Exercise: Aim for 150 min of moderate intensity exercise weekly for heart health, and weights twice weekly for bone health Stay active - any steps are better than no steps! Aim for 7-9 hours of sleep daily

## 2023-11-02 ENCOUNTER — Ambulatory Visit: Payer: Commercial Managed Care - PPO | Admitting: Dermatology

## 2024-01-29 ENCOUNTER — Other Ambulatory Visit: Payer: Self-pay | Admitting: Cardiovascular Disease

## 2024-01-29 DIAGNOSIS — I10 Essential (primary) hypertension: Secondary | ICD-10-CM

## 2024-01-29 DIAGNOSIS — E782 Mixed hyperlipidemia: Secondary | ICD-10-CM

## 2024-02-09 ENCOUNTER — Ambulatory Visit: Payer: Commercial Managed Care - PPO | Admitting: Dermatology

## 2024-03-21 ENCOUNTER — Ambulatory Visit
Admission: RE | Admit: 2024-03-21 | Discharge: 2024-03-21 | Disposition: A | Discharge status: Home / Self Care | Source: Ambulatory Visit | Attending: Family Medicine | Admitting: Family Medicine

## 2024-03-21 ENCOUNTER — Other Ambulatory Visit: Payer: Self-pay | Admitting: Family Medicine

## 2024-03-21 DIAGNOSIS — M5416 Radiculopathy, lumbar region: Secondary | ICD-10-CM | POA: Insufficient documentation

## 2024-03-24 ENCOUNTER — Other Ambulatory Visit: Payer: Self-pay | Admitting: Family Medicine

## 2024-03-24 DIAGNOSIS — M5416 Radiculopathy, lumbar region: Secondary | ICD-10-CM

## 2024-03-29 NOTE — Discharge Instructions (Signed)

## 2024-03-30 ENCOUNTER — Inpatient Hospital Stay
Admission: RE | Admit: 2024-03-30 | Discharge: 2024-03-30 | Disposition: A | Discharge status: Home / Self Care | Source: Ambulatory Visit | Attending: Family Medicine | Admitting: Family Medicine

## 2024-04-21 ENCOUNTER — Other Ambulatory Visit: Payer: Self-pay | Admitting: Neurosurgery

## 2024-05-03 NOTE — Progress Notes (Signed)
 Surgical Instructions   Your procedure is scheduled on Wednesday, June 4th, 2025. Report to [facility] Main Entrance "A" at 6:30 A.M., then check in with the Admitting office. Any questions or running late day of surgery: call [phone]  Questions prior to your surgery date: call [phone], Monday-Friday, 8am-4pm. If you experience any cold or flu symptoms such as cough, fever, chills, shortness of breath, etc. between now and your scheduled surgery, please notify us  at the above number.     Remember:  Do not eat or drink after midnight the night before your surgery    Take these medicines the morning of surgery with A SIP OF WATER: Rosuvastatin  (Crestor )   May take these medicines IF NEEDED: Tramadol (Ultram)   Follow your surgeon's instructions on when to stop Aspirin.  If no instructions received, reach out to your surgeon's office.   One week prior to surgery, STOP taking any Aleve, Naproxen, Ibuprofen, Motrin, Advil, Goody's, BC's, all herbal medications, fish oil, and non-prescription vitamins.    HOW TO MANAGE YOUR DIABETES BEFORE AND AFTER SURGERY  Why is it important to control my blood sugar before and after surgery? Improving blood sugar levels before and after surgery helps healing and can limit problems. A way of improving blood sugar control is eating a healthy diet by:  Eating less sugar and carbohydrates  Increasing activity/exercise  Talking with your doctor about reaching your blood sugar goals High blood sugars (greater than 180 mg/dL) can raise your risk of infections and slow your recovery, so you will need to focus on controlling your diabetes during the weeks before surgery. Make sure that the doctor who takes care of your diabetes knows about your planned surgery including the date and location.  How do I manage my blood sugar before surgery? Check your blood sugar at least 4 times a day, starting 2 days before surgery, to make sure that the  level is not too high or low.  Check your blood sugar the morning of your surgery when you wake up and every 2 hours until you get to the Short Stay unit.  If your blood sugar is less than 70 mg/dL, you will need to treat for low blood sugar: Do not take insulin. Treat a low blood sugar (less than 70 mg/dL) with  cup of clear juice (cranberry or apple), 4 glucose tablets, OR glucose gel. Recheck blood sugar in 15 minutes after treatment (to make sure it is greater than 70 mg/dL). If your blood sugar is not greater than 70 mg/dL on recheck, call [phone] for further instructions. Report your blood sugar to the short stay nurse when you get to Short Stay.  If you are admitted to the hospital after surgery: Your blood sugar will be checked by the staff and you will probably be given insulin after surgery (instead of oral diabetes medicines) to make sure you have good blood sugar levels. The goal for blood sugar control after surgery is 80-180 mg/dL.                      Do NOT Smoke (Tobacco/Vaping) for 24 hours prior to your procedure.  If you use a CPAP at night, you may bring your mask/headgear for your overnight stay.   You will be asked to remove any contacts, glasses, piercing's, hearing aid's, dentures/partials prior to surgery. Please bring cases for these items if needed.    Patients discharged the day of surgery will not be allowed  to drive home, and someone needs to stay with them for 24 hours.  SURGICAL WAITING ROOM VISITATION Patients may have no more than 2 support people in the waiting area - these visitors may rotate.   Pre-op nurse will coordinate an appropriate time for 1 ADULT support person, who may not rotate, to accompany patient in pre-op.  Children under the age of 7 must have an adult with them who is not the patient and must remain in the main waiting area with an adult.  If the patient needs to stay at the hospital during part of their recovery, the visitor  guidelines for inpatient rooms apply.  Please refer to the [facility] website for the visitor guidelines for any additional information.   If you received a COVID test during your pre-op visit  it is requested that you wear a mask when out in public, stay away from anyone that may not be feeling well and notify your surgeon if you develop symptoms. If you have been in contact with anyone that has tested positive in the last 10 days please notify you surgeon.      Pre-operative 5 CHG Bathing Instructions   You can play a key role in reducing the risk of infection after surgery. Your skin needs to be as free of germs as possible. You can reduce the number of germs on your skin by washing with CHG (chlorhexidine gluconate) soap before surgery. CHG is an antiseptic soap that kills germs and continues to kill germs even after washing.   DO NOT use if you have an allergy to chlorhexidine/CHG or antibacterial soaps. If your skin becomes reddened or irritated, stop using the CHG and notify one of our RNs at [phone].   Please shower with the CHG soap starting 4 days before surgery using the following schedule:     Please keep in mind the following:  DO NOT shave, including legs and underarms, starting the day of your first shower.   You may shave your face at any point before/day of surgery.  Place clean sheets on your bed the day you start using CHG soap. Use a clean washcloth (not used since being washed) for each shower. DO NOT sleep with pets once you start using the CHG.   CHG Shower Instructions:  Wash your face and private area with normal soap. If you choose to wash your hair, wash first with your normal shampoo.  After you use shampoo/soap, rinse your hair and body thoroughly to remove shampoo/soap residue.  Turn the water OFF and apply about 3 tablespoons (45 ml) of CHG soap to a CLEAN washcloth.  Apply CHG soap ONLY FROM YOUR NECK DOWN TO YOUR TOES (washing for 3-5 minutes)   DO NOT use CHG soap on face, private areas, open wounds, or sores.  Pay special attention to the area where your surgery is being performed.  If you are having back surgery, having someone wash your back for you may be helpful. Wait 2 minutes after CHG soap is applied, then you may rinse off the CHG soap.  Pat dry with a clean towel  Put on clean clothes/pajamas   If you choose to wear lotion, please use ONLY the CHG-compatible lotions that are listed below.  Additional instructions for the day of surgery: DO NOT APPLY any lotions, deodorants, cologne, or perfumes.   Do not bring valuables to the hospital. [facility] [place] is not responsible for any belongings/valuables. Do not wear nail polish, gel polish, artificial  nails, or any other type of covering on natural nails (fingers and toes) Do not wear jewelry or makeup Put on clean/comfortable clothes.  Please brush your teeth.  Ask your nurse before applying any prescription medications to the skin.     CHG Compatible Lotions   Aveeno Moisturizing lotion  Cetaphil Moisturizing Cream  Cetaphil Moisturizing Lotion  Clairol Herbal Essence Moisturizing Lotion, Dry Skin  Clairol Herbal Essence Moisturizing Lotion, Extra Dry Skin  Clairol Herbal Essence Moisturizing Lotion, Normal Skin  Curel Age Defying Therapeutic Moisturizing Lotion with Alpha Hydroxy  Curel Extreme Care Body Lotion  Curel Soothing Hands Moisturizing Hand Lotion  Curel Therapeutic Moisturizing Cream, Fragrance-Free  Curel Therapeutic Moisturizing Lotion, Fragrance-Free  Curel Therapeutic Moisturizing Lotion, Original Formula  Eucerin Daily Replenishing Lotion  Eucerin Dry Skin Therapy Plus Alpha Hydroxy Crme  Eucerin Dry Skin Therapy Plus Alpha Hydroxy Lotion  Eucerin Original Crme  Eucerin Original Lotion  Eucerin Plus Crme Eucerin Plus Lotion  Eucerin TriLipid Replenishing Lotion  Keri Anti-Bacterial Hand Lotion  Keri Deep Conditioning Original Lotion Dry  Skin Formula Softly Scented  Keri Deep Conditioning Original Lotion, Fragrance Free Sensitive Skin Formula  Keri Lotion Fast Absorbing Fragrance Free Sensitive Skin Formula  Keri Lotion Fast Absorbing Softly Scented Dry Skin Formula  Keri Original Lotion  Keri Skin Renewal Lotion Keri Silky Smooth Lotion  Keri Silky Smooth Sensitive Skin Lotion  Nivea Body Creamy Conditioning Oil  Nivea Body Extra Enriched Lotion  Nivea Body Original Lotion  Nivea Body Sheer Moisturizing Lotion Nivea Crme  Nivea Skin Firming Lotion  NutraDerm 30 Skin Lotion  NutraDerm Skin Lotion  NutraDerm Therapeutic Skin Cream  NutraDerm Therapeutic Skin Lotion  ProShield Protective Hand Cream  Provon moisturizing lotion  Please read over the following fact sheets that you were given.

## 2024-05-04 ENCOUNTER — Other Ambulatory Visit: Payer: Self-pay

## 2024-05-04 ENCOUNTER — Encounter (HOSPITAL_COMMUNITY)
Admission: RE | Admit: 2024-05-04 | Discharge: 2024-05-04 | Disposition: A | Discharge status: Home / Self Care | Source: Ambulatory Visit | Attending: Neurosurgery | Admitting: Neurosurgery

## 2024-05-04 ENCOUNTER — Encounter (HOSPITAL_COMMUNITY): Payer: Self-pay

## 2024-05-04 VITALS — BP 154/80 | HR 86 | Temp 98.4°F | Resp 18 | Ht 67.0 in | Wt 192.4 lb

## 2024-05-04 DIAGNOSIS — Z01812 Encounter for preprocedural laboratory examination: Secondary | ICD-10-CM | POA: Diagnosis not present

## 2024-05-04 DIAGNOSIS — Z01818 Encounter for other preprocedural examination: Secondary | ICD-10-CM | POA: Diagnosis present

## 2024-05-04 DIAGNOSIS — I10 Essential (primary) hypertension: Secondary | ICD-10-CM | POA: Insufficient documentation

## 2024-05-04 DIAGNOSIS — Z79899 Other long term (current) drug therapy: Secondary | ICD-10-CM | POA: Insufficient documentation

## 2024-05-04 DIAGNOSIS — M4316 Spondylolisthesis, lumbar region: Secondary | ICD-10-CM | POA: Insufficient documentation

## 2024-05-04 DIAGNOSIS — E119 Type 2 diabetes mellitus without complications: Secondary | ICD-10-CM | POA: Insufficient documentation

## 2024-05-04 DIAGNOSIS — Z85828 Personal history of other malignant neoplasm of skin: Secondary | ICD-10-CM | POA: Insufficient documentation

## 2024-05-04 HISTORY — DX: Unspecified osteoarthritis, unspecified site: M19.90

## 2024-05-04 LAB — BASIC METABOLIC PANEL WITH GFR
Anion gap: 8 (ref 5–15)
BUN: 16 mg/dL (ref 8–23)
CO2: 26 mmol/L (ref 22–32)
Calcium: 10 mg/dL (ref 8.9–10.3)
Chloride: 102 mmol/L (ref 98–111)
Creatinine, Ser: 0.97 mg/dL (ref 0.61–1.24)
GFR, Estimated: >60 mL/min (ref >60)
Glucose, Bld: 149 mg/dL — ABNORMAL HIGH (ref 70–99)
Potassium: 4.7 mmol/L (ref 3.5–5.1)
Sodium: 136 mmol/L (ref 135–145)

## 2024-05-04 LAB — CBC
HCT: 42.2 % (ref 39.0–52.0)
Hemoglobin: 14.7 g/dL (ref 13.0–17.0)
MCH: 32 pg (ref 26.0–34.0)
MCHC: 34.8 g/dL (ref 30.0–36.0)
MCV: 91.9 fL (ref 80.0–100.0)
Platelets: 242 10*3/uL (ref 150–400)
RBC: 4.59 MIL/uL (ref 4.22–5.81)
RDW: 13.3 % (ref 11.5–15.5)
WBC: 11.3 10*3/uL — ABNORMAL HIGH (ref 4.0–10.5)
nRBC: 0 % (ref 0.0–0.2)

## 2024-05-04 LAB — HEMOGLOBIN A1C
Hgb A1c MFr Bld: 6.5 % — ABNORMAL HIGH (ref 4.8–5.6)
Mean Plasma Glucose: 139.85 mg/dL

## 2024-05-04 LAB — GLUCOSE, CAPILLARY: Glucose-Capillary: 156 mg/dL — ABNORMAL HIGH (ref 70–99)

## 2024-05-04 LAB — TYPE AND SCREEN
ABO/RH(D): A POS
Antibody Screen: NEGATIVE

## 2024-05-04 LAB — SURGICAL PCR SCREEN
MRSA, PCR: NEGATIVE
Staphylococcus aureus: NEGATIVE

## 2024-05-04 NOTE — Progress Notes (Addendum)
 PCP - Dr. Lyle San Cardiologist - Dr. Ahmad Alert, LOV, 10/28/2023, saw Jorge Maclachlan, NP  PPM/ICD - denies Device Orders - na Rep Notified - na  Chest x-ray - na EKG - 10/28/2023 Stress Test -  ECHO -  Cardiac Cath -   Sleep Study - Had a sleep study 20 years ago, no diagnoses of sleep apnea CPAP - na  Type II diabetic, Blood sugar 156 at PAT appointment.  Patient denies Fasting Blood Sugar - na Checks Blood Sugar: na  Last dose of GLP1 agonist-  denies GLP1 instructions: na  Blood Thinner Instructions: denies Aspirin Instructions:last dose over 3 weeks ago  ERAS Protcol - NPO  Anesthesia review: Yes. CAD, HTN, DM.   Patient denies shortness of breath, fever, cough and chest pain at PAT appointment   All instructions explained to the patient, with a verbal understanding of the material. Patient agrees to go over the instructions while at home for a better understanding. Patient also instructed to self quarantine after being tested for COVID-19. The opportunity to ask questions was provided.

## 2024-05-05 NOTE — Progress Notes (Signed)
 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  History includes never smoker, HTN, HLD, DM2, skin cancer, elevated coronary calcium  score (1329, 98% percentile 11/12/22) with familiy history of CAD (father, brothers). He was phycially active without CV symptoms, so has been managed with Crestor . At last visit on 10/28/23 with Jorge Maclachlan, NP, "He is physically active and denies chest pain, dyspnea, or other symptoms concerning for angina.  No indication for further ischemic evaluation at this time. LDL is at goal. Focus on secondary prevention..."  EKG normal. One year follow-up planned.   He in on ASA 81 mg (currently on hold), Crestor  20 mg, and Zestoretic  daily.   No ischemic testing recommended at his routine follow-up six months ago. He denied chest pain and SOB at PAT RN visit. A1c 6.5%. If no acute changes then would anticipate that he can proceed as planned. Discussed with anesthesiologist Gorman Laughter, Jorge Grant.  VS: BP (!) 154/80   Pulse 86   Temp 36.9 C   Resp 18   Ht 5\' 7"  (1.702 m)   Wt 87.3 kg   SpO2 99%   BMI 30.13 kg/m   PROVIDERS: Lyle San, Jorge Grant is PCP Ahmad Alert, Jorge Grant is cardiologist  Darlynn Elam, Jorge Grant is urologist    LABS: Labs reviewed: Acceptable for surgery. (all labs ordered are listed, but only abnormal results are displayed)  Labs Reviewed  GLUCOSE, CAPILLARY - Abnormal; Notable for the following components:      Result Value   Glucose-Capillary 156 (*)    All other components within normal limits  HEMOGLOBIN A1C - Abnormal; Notable for the following  components:   Hgb A1c MFr Bld 6.5 (*)    All other components within normal limits  BASIC METABOLIC PANEL WITH GFR - Abnormal; Notable for the following components:   Glucose, Bld 149 (*)    All other components within normal limits  CBC - Abnormal; Notable for the following components:   WBC 11.3 (*)    All other components within normal limits  SURGICAL PCR SCREEN  TYPE AND SCREEN     IMAGES: MRI L-spine 03/21/24: IMPRESSION: 1. Symptomatic level appears to be L4-L5 where chronic grade 1 anterolisthesis is associated with bulky posterior element degeneration including a new, a new left side synovial cyst since the 2022 MRI. Increased and severe spinal and lateral recess stenosis. Stable moderate right foraminal stenosis. Query Left L5 radiculitis. 2. Stable moderate chronic spinal stenosis at L3-L4. Increased mild multifactorial spinal stenosis at L2-L3.    EKG: EKG 10/28/23: Normal sinus rhythm Normal ECG No ST abnormality Confirmed by Slater Duncan [phone]) on 10/28/2023 8:00:39 AM  CV: CT Cardiac Calcium  Scoring 10/12/22: LM 0 LAD 540  LCx 101 RCA 688 Total 1329   IMPRESSION AND RECOMMENDATION: 1. Coronary calcium  score of 1329. This was 98th percentile for age and sex matched control. 2. CAC >300 in LAD, LCx, RCA. CAC-DRS A3/N3. 3. Recommend aspirin and statin if no contraindication. 4. Recommend cardiology consultation. 5. Continue heart healthy lifestyle and risk factor modification.    Past Medical History:  Diagnosis Date   AK (  actinic keratosis)    Arthritis    Basal cell carcinoma 02/01/2017   R temple approx 2.0 cm post to lat brow   DDD (degenerative disc disease), lumbar    DM (diabetes mellitus) (HCC)    Hyperlipidemia    Hypertension    Lumbar radiculitis    Lumbar radiculopathy    Nodular prostate     Past Surgical History:  Procedure Laterality Date   COLONOSCOPY N/A 11/29/2015   Procedure: COLONOSCOPY;  Surgeon: Cassie Click, Jorge Grant;  Location: [facility] ENDOSCOPY;  Service: Endoscopy;  Laterality: N/A;   HERNIA REPAIR     SHOULDER SURGERY Right    VASECTOMY  10/17/2021    MEDICATIONS:  aspirin EC 81 MG tablet   lisinopril -hydrochlorothiazide  (ZESTORETIC ) 20-25 MG tablet   rosuvastatin  (CRESTOR ) 20 MG tablet   tadalafil  (CIALIS ) 20 MG tablet   traMADol (ULTRAM) 50 MG tablet   No current facility-administered medications for this encounter.    Ella Gun, PA-C Surgical Short Stay/Anesthesiology [facility] Phone [phone] [facility] Phone [phone] 05/09/2024 4:01 PM

## 2024-05-09 NOTE — Anesthesia Preprocedure Evaluation (Addendum)
 Anesthesia Evaluation  Patient identified by MRN, date of birth, ID band Patient awake    Reviewed: Allergy & Precautions, NPO status , Patient's Chart, lab work & pertinent test results  Airway Mallampati: II  TM Distance: >3 FB Neck ROM: Full    Dental no notable dental hx.    Pulmonary    Pulmonary exam normal        Cardiovascular hypertension, Pt. on medications + CAD   Rhythm:Regular Rate:Normal     Neuro/Psych negative neurological ROS  negative psych ROS   GI/Hepatic negative GI ROS, Neg liver ROS,,,  Endo/Other  diabetes    Renal/GU   negative genitourinary   Musculoskeletal  (+) Arthritis , Osteoarthritis,    Abdominal Normal abdominal exam  (+)   Peds  Hematology Lab Results      Component                Value               Date                      WBC                      11.3 (H)            05/04/2024                HGB                      14.7                05/04/2024                HCT                      42.2                05/04/2024                MCV                      91.9                05/04/2024                PLT                      242                 05/04/2024             Lab Results      Component                Value               Date                      NA                       136                 05/04/2024                K  4.7                 05/04/2024                CO2                      26                  05/04/2024                GLUCOSE                  149 (H)             05/04/2024                BUN                      16                  05/04/2024                CREATININE               0.97                05/04/2024                CALCIUM                   10.0                05/04/2024                EGFR                     98                  04/16/2023                GFRNONAA                 >60                 05/04/2024               Anesthesia Other Findings   Reproductive/Obstetrics                             Anesthesia Physical Anesthesia Plan  ASA: 2  Anesthesia Plan: General   Post-op Pain Management: Tylenol  PO (pre-op)* and Celebrex PO (pre-op)*   Induction: Intravenous  PONV Risk Score and Plan: 2 and Ondansetron, Dexamethasone and Treatment may vary due to age or medical condition  Airway Management Planned: Mask and Oral ETT  Additional Equipment: None  Intra-op Plan:   Post-operative Plan: Extubation in OR  Informed Consent: I have reviewed the patients History and Physical, chart, labs and discussed the procedure including the risks, benefits and alternatives for the proposed anesthesia with the patient or authorized representative who has indicated his/her understanding and acceptance.     Dental advisory given  Plan Discussed with: CRNA  Anesthesia Plan Comments: (PAT note written 05/09/2024 by Allison Zelenak, PA-C.  )       Anesthesia Quick Evaluation

## 2024-05-17 ENCOUNTER — Ambulatory Visit (HOSPITAL_COMMUNITY): Payer: Self-pay | Admitting: Vascular Surgery

## 2024-05-17 ENCOUNTER — Ambulatory Visit (HOSPITAL_BASED_OUTPATIENT_CLINIC_OR_DEPARTMENT_OTHER)

## 2024-05-17 ENCOUNTER — Ambulatory Visit (HOSPITAL_COMMUNITY)

## 2024-05-17 ENCOUNTER — Encounter (HOSPITAL_COMMUNITY): Payer: Self-pay | Admitting: Neurosurgery

## 2024-05-17 ENCOUNTER — Other Ambulatory Visit: Payer: Self-pay

## 2024-05-17 ENCOUNTER — Encounter (HOSPITAL_COMMUNITY)
Admission: RE | Disposition: A | Discharge status: Home / Self Care | Payer: Self-pay | Source: Home / Self Care | Attending: Neurosurgery

## 2024-05-17 ENCOUNTER — Ambulatory Visit (HOSPITAL_COMMUNITY)
Admission: RE | Admit: 2024-05-17 | Discharge: 2024-05-18 | Disposition: A | Discharge status: Home / Self Care | Attending: Neurosurgery | Admitting: Neurosurgery

## 2024-05-17 DIAGNOSIS — E119 Type 2 diabetes mellitus without complications: Secondary | ICD-10-CM | POA: Diagnosis not present

## 2024-05-17 DIAGNOSIS — M5116 Intervertebral disc disorders with radiculopathy, lumbar region: Secondary | ICD-10-CM | POA: Diagnosis not present

## 2024-05-17 DIAGNOSIS — I2510 Atherosclerotic heart disease of native coronary artery without angina pectoris: Secondary | ICD-10-CM | POA: Insufficient documentation

## 2024-05-17 DIAGNOSIS — M48062 Spinal stenosis, lumbar region with neurogenic claudication: Secondary | ICD-10-CM | POA: Diagnosis not present

## 2024-05-17 DIAGNOSIS — Z79899 Other long term (current) drug therapy: Secondary | ICD-10-CM | POA: Diagnosis not present

## 2024-05-17 DIAGNOSIS — M4316 Spondylolisthesis, lumbar region: Secondary | ICD-10-CM | POA: Diagnosis present

## 2024-05-17 DIAGNOSIS — I10 Essential (primary) hypertension: Secondary | ICD-10-CM

## 2024-05-17 DIAGNOSIS — M4726 Other spondylosis with radiculopathy, lumbar region: Secondary | ICD-10-CM | POA: Diagnosis not present

## 2024-05-17 LAB — GLUCOSE, CAPILLARY
Glucose-Capillary: 145 mg/dL — ABNORMAL HIGH (ref 70–99)
Glucose-Capillary: 176 mg/dL — ABNORMAL HIGH (ref 70–99)
Glucose-Capillary: 258 mg/dL — ABNORMAL HIGH (ref 70–99)
Glucose-Capillary: 140 mg/dL — ABNORMAL HIGH (ref 70–99)

## 2024-05-17 LAB — ABO/RH: ABO/RH(D): A POS

## 2024-05-17 SURGERY — POSTERIOR LUMBAR FUSION 1 LEVEL
Anesthesia: General

## 2024-05-17 MED ORDER — CHLORHEXIDINE GLUCONATE CLOTH 2 % EX PADS: 6.0000 | MEDICATED_PAD | Freq: Once | CUTANEOUS | Status: DC

## 2024-05-17 MED ORDER — ALBUMIN HUMAN 5 % IV SOLN
INTRAVENOUS | Status: DC | PRN
Start: 2024-05-17 — End: 2024-05-17

## 2024-05-17 MED ORDER — OXYCODONE HCL 5 MG PO TABS
5.0000 mg | ORAL_TABLET | ORAL | Status: DC | PRN
  Administered 2024-05-18 (×2): 10 mg via ORAL
  Filled 2024-05-17 (×2): qty 2

## 2024-05-17 MED ORDER — SODIUM CHLORIDE 0.9% FLUSH
3.0000 mL | Freq: Two times a day (BID) | INTRAVENOUS | Status: DC
  Administered 2024-05-17: 3 mL via INTRAVENOUS

## 2024-05-17 MED ORDER — CEFAZOLIN SODIUM-DEXTROSE 2-4 GM/100ML-% IV SOLN
2.0000 g | INTRAVENOUS | Status: AC
  Administered 2024-05-17: 2 g via INTRAVENOUS

## 2024-05-17 MED ORDER — LACTATED RINGERS IV SOLN: INTRAVENOUS | Status: DC

## 2024-05-17 MED ORDER — PROPOFOL 10 MG/ML IV BOLUS
INTRAVENOUS | Status: DC | PRN
  Administered 2024-05-17: 20 mg via INTRAVENOUS
  Administered 2024-05-17: 180 mg via INTRAVENOUS

## 2024-05-17 MED ORDER — 0.9 % SODIUM CHLORIDE (POUR BTL) OPTIME
TOPICAL | Status: DC | PRN
  Administered 2024-05-17: 1000 mL

## 2024-05-17 MED ORDER — PHENYLEPHRINE 80 MCG/ML (10ML) SYRINGE FOR IV PUSH (FOR BLOOD PRESSURE SUPPORT)
PREFILLED_SYRINGE | INTRAVENOUS | Status: AC
Start: 2024-05-17 — End: ?
  Filled 2024-05-17: qty 10

## 2024-05-17 MED ORDER — ONDANSETRON HCL 4 MG/2ML IJ SOLN
INTRAMUSCULAR | Status: DC | PRN
  Administered 2024-05-17: 4 mg via INTRAVENOUS

## 2024-05-17 MED ORDER — PHENOL 1.4 % MT LIQD: 1.0000 | OROMUCOSAL | Status: DC | PRN

## 2024-05-17 MED ORDER — INSULIN ASPART 100 UNIT/ML IJ SOLN
0.0000 [IU] | INTRAMUSCULAR | Status: DC | PRN
  Administered 2024-05-17: 2 [IU] via SUBCUTANEOUS
  Filled 2024-05-17: qty 1

## 2024-05-17 MED ORDER — ROCURONIUM BROMIDE 10 MG/ML (PF) SYRINGE
PREFILLED_SYRINGE | INTRAVENOUS | Status: DC | PRN
  Administered 2024-05-17: 10 mg via INTRAVENOUS
  Administered 2024-05-17: 60 mg via INTRAVENOUS
  Administered 2024-05-17: 10 mg via INTRAVENOUS
  Administered 2024-05-17: 20 mg via INTRAVENOUS

## 2024-05-17 MED ORDER — THROMBIN 5000 UNITS EX KIT
PACK | CUTANEOUS | Status: AC
  Filled 2024-05-17: qty 1

## 2024-05-17 MED ORDER — DEXAMETHASONE SODIUM PHOSPHATE 10 MG/ML IJ SOLN
INTRAMUSCULAR | Status: AC
  Filled 2024-05-17: qty 1

## 2024-05-17 MED ORDER — CHLORHEXIDINE GLUCONATE 0.12 % MT SOLN
15.0000 mL | Freq: Once | OROMUCOSAL | Status: AC
  Administered 2024-05-17: 15 mL via OROMUCOSAL
  Filled 2024-05-17: qty 15

## 2024-05-17 MED ORDER — PROPOFOL 10 MG/ML IV BOLUS
INTRAVENOUS | Status: AC
  Filled 2024-05-17: qty 20

## 2024-05-17 MED ORDER — OXYCODONE HCL 5 MG PO TABS
5.0000 mg | ORAL_TABLET | ORAL | Status: DC | PRN
  Administered 2024-05-17 (×2): 5 mg via ORAL
  Filled 2024-05-17: qty 1

## 2024-05-17 MED ORDER — EPHEDRINE 5 MG/ML INJ
INTRAVENOUS | Status: AC
  Filled 2024-05-17: qty 5

## 2024-05-17 MED ORDER — HYDROCHLOROTHIAZIDE 25 MG PO TABS
25.0000 mg | ORAL_TABLET | Freq: Every day | ORAL | Status: DC
  Filled 2024-05-17: qty 1

## 2024-05-17 MED ORDER — CYCLOBENZAPRINE HCL 10 MG PO TABS
10.0000 mg | ORAL_TABLET | Freq: Three times a day (TID) | ORAL | Status: DC | PRN
Start: 2024-05-17 — End: 2024-05-18
  Administered 2024-05-17: 10 mg via ORAL
  Filled 2024-05-17 (×3): qty 1

## 2024-05-17 MED ORDER — FENTANYL CITRATE (PF) 250 MCG/5ML IJ SOLN
INTRAMUSCULAR | Status: DC | PRN
  Administered 2024-05-17: 100 ug via INTRAVENOUS
  Administered 2024-05-17 (×3): 50 ug via INTRAVENOUS

## 2024-05-17 MED ORDER — ONDANSETRON HCL 4 MG PO TABS: 4.0000 mg | ORAL_TABLET | Freq: Four times a day (QID) | ORAL | Status: DC | PRN

## 2024-05-17 MED ORDER — INSULIN ASPART 100 UNIT/ML IJ SOLN: 0.0000 [IU] | Freq: Every day | INTRAMUSCULAR | Status: DC

## 2024-05-17 MED ORDER — SUGAMMADEX SODIUM 200 MG/2ML IV SOLN
INTRAVENOUS | Status: DC | PRN
  Administered 2024-05-17: 200 mg via INTRAVENOUS

## 2024-05-17 MED ORDER — CELECOXIB 200 MG PO CAPS
200.0000 mg | ORAL_CAPSULE | Freq: Once | ORAL | Status: AC
Start: 2024-05-17 — End: 2024-05-17
  Administered 2024-05-17: 200 mg via ORAL

## 2024-05-17 MED ORDER — BUPIVACAINE-EPINEPHRINE (PF) 0.5% -1:200000 IJ SOLN
INTRAMUSCULAR | Status: DC | PRN
  Administered 2024-05-17: 10 mL via PERINEURAL

## 2024-05-17 MED ORDER — BISACODYL 10 MG RE SUPP: 10.0000 mg | Freq: Every day | RECTAL | Status: DC | PRN

## 2024-05-17 MED ORDER — CEFAZOLIN SODIUM-DEXTROSE 2-4 GM/100ML-% IV SOLN
2.0000 g | Freq: Three times a day (TID) | INTRAVENOUS | Status: AC
  Administered 2024-05-17 – 2024-05-18 (×2): 2 g via INTRAVENOUS
  Filled 2024-05-17 (×2): qty 100

## 2024-05-17 MED ORDER — HYDROMORPHONE HCL 1 MG/ML IJ SOLN
0.2500 mg | INTRAMUSCULAR | Status: DC | PRN
  Administered 2024-05-17 (×2): 0.5 mg via INTRAVENOUS

## 2024-05-17 MED ORDER — SODIUM CHLORIDE 0.9 % IV BOLUS
500.0000 mL | Freq: Once | INTRAVENOUS | Status: AC
  Administered 2024-05-17: 500 mL via INTRAVENOUS

## 2024-05-17 MED ORDER — ACETAMINOPHEN 650 MG RE SUPP: 650.0000 mg | RECTAL | Status: DC | PRN

## 2024-05-17 MED ORDER — MORPHINE SULFATE (PF) 2 MG/ML IV SOLN: 2.0000 mg | INTRAVENOUS | Status: DC | PRN

## 2024-05-17 MED ORDER — DOCUSATE SODIUM 100 MG PO CAPS
100.0000 mg | ORAL_CAPSULE | Freq: Two times a day (BID) | ORAL | Status: DC
  Administered 2024-05-17 – 2024-05-18 (×3): 100 mg via ORAL
  Filled 2024-05-17 (×3): qty 1

## 2024-05-17 MED ORDER — DROPERIDOL 2.5 MG/ML IJ SOLN: 0.6250 mg | Freq: Once | INTRAMUSCULAR | Status: DC | PRN

## 2024-05-17 MED ORDER — LISINOPRIL-HYDROCHLOROTHIAZIDE 20-25 MG PO TABS: 1.0000 | ORAL_TABLET | Freq: Every day | ORAL | Status: DC

## 2024-05-17 MED ORDER — MIDAZOLAM HCL 2 MG/2ML IJ SOLN
INTRAMUSCULAR | Status: AC
  Filled 2024-05-17: qty 2

## 2024-05-17 MED ORDER — LIDOCAINE 2% (20 MG/ML) 5 ML SYRINGE
INTRAMUSCULAR | Status: AC
  Filled 2024-05-17: qty 5

## 2024-05-17 MED ORDER — SODIUM CHLORIDE 0.9 % IV SOLN
250.0000 mL | INTRAVENOUS | Status: DC
  Administered 2024-05-17: 250 mL via INTRAVENOUS

## 2024-05-17 MED ORDER — DEXAMETHASONE SODIUM PHOSPHATE 10 MG/ML IJ SOLN
INTRAMUSCULAR | Status: DC | PRN
  Administered 2024-05-17: 5 mg via INTRAVENOUS

## 2024-05-17 MED ORDER — BUPIVACAINE LIPOSOME 1.3 % IJ SUSP
INTRAMUSCULAR | Status: AC
  Filled 2024-05-17: qty 20

## 2024-05-17 MED ORDER — BACITRACIN ZINC 500 UNIT/GM EX OINT
TOPICAL_OINTMENT | CUTANEOUS | Status: AC
  Filled 2024-05-17: qty 28.35

## 2024-05-17 MED ORDER — ACETAMINOPHEN 500 MG PO TABS
ORAL_TABLET | ORAL | Status: AC
  Filled 2024-05-17: qty 2

## 2024-05-17 MED ORDER — BACITRACIN ZINC 500 UNIT/GM EX OINT
TOPICAL_OINTMENT | CUTANEOUS | Status: DC | PRN
  Administered 2024-05-17: 1 via TOPICAL

## 2024-05-17 MED ORDER — VASHE WOUND IRRIGATION OPTIME
TOPICAL | Status: DC | PRN
  Administered 2024-05-17: 34 [oz_av]

## 2024-05-17 MED ORDER — MENTHOL 3 MG MT LOZG: 1.0000 | LOZENGE | OROMUCOSAL | Status: DC | PRN

## 2024-05-17 MED ORDER — PHENYLEPHRINE 80 MCG/ML (10ML) SYRINGE FOR IV PUSH (FOR BLOOD PRESSURE SUPPORT)
PREFILLED_SYRINGE | INTRAVENOUS | Status: DC | PRN
  Administered 2024-05-17 (×5): 160 ug via INTRAVENOUS

## 2024-05-17 MED ORDER — LISINOPRIL 20 MG PO TABS
20.0000 mg | ORAL_TABLET | Freq: Every day | ORAL | Status: DC
  Filled 2024-05-17: qty 1

## 2024-05-17 MED ORDER — EPHEDRINE SULFATE-NACL 50-0.9 MG/10ML-% IV SOSY
PREFILLED_SYRINGE | INTRAVENOUS | Status: DC | PRN
  Administered 2024-05-17 (×2): 5 mg via INTRAVENOUS

## 2024-05-17 MED ORDER — BUPIVACAINE-EPINEPHRINE (PF) 0.5% -1:200000 IJ SOLN
INTRAMUSCULAR | Status: AC
  Filled 2024-05-17: qty 30

## 2024-05-17 MED ORDER — ACETAMINOPHEN 500 MG PO TABS
1000.0000 mg | ORAL_TABLET | Freq: Once | ORAL | Status: AC
  Administered 2024-05-17: 1000 mg via ORAL

## 2024-05-17 MED ORDER — MIDAZOLAM HCL 2 MG/2ML IJ SOLN
INTRAMUSCULAR | Status: DC | PRN
  Administered 2024-05-17: 2 mg via INTRAVENOUS

## 2024-05-17 MED ORDER — HYDROMORPHONE HCL 1 MG/ML IJ SOLN
INTRAMUSCULAR | Status: AC
  Filled 2024-05-17: qty 1

## 2024-05-17 MED ORDER — OXYCODONE HCL 5 MG PO TABS
ORAL_TABLET | ORAL | Status: AC
Start: 2024-05-17 — End: ?
  Filled 2024-05-17: qty 1

## 2024-05-17 MED ORDER — INSULIN ASPART 100 UNIT/ML IJ SOLN: 0.0000 [IU] | Freq: Three times a day (TID) | INTRAMUSCULAR | Status: DC

## 2024-05-17 MED ORDER — ROSUVASTATIN CALCIUM 20 MG PO TABS
20.0000 mg | ORAL_TABLET | Freq: Every day | ORAL | Status: DC
  Administered 2024-05-17: 20 mg via ORAL
  Filled 2024-05-17: qty 1

## 2024-05-17 MED ORDER — LIDOCAINE 2% (20 MG/ML) 5 ML SYRINGE
INTRAMUSCULAR | Status: DC | PRN
  Administered 2024-05-17: 80 mg via INTRAVENOUS

## 2024-05-17 MED ORDER — PHENYLEPHRINE HCL-NACL 20-0.9 MG/250ML-% IV SOLN
INTRAVENOUS | Status: DC | PRN
  Administered 2024-05-17: 20 ug/min via INTRAVENOUS

## 2024-05-17 MED ORDER — ACETAMINOPHEN 325 MG PO TABS: 650.0000 mg | ORAL_TABLET | ORAL | Status: DC | PRN

## 2024-05-17 MED ORDER — ORAL CARE MOUTH RINSE: 15.0000 mL | Freq: Once | OROMUCOSAL | Status: AC

## 2024-05-17 MED ORDER — BUPIVACAINE LIPOSOME 1.3 % IJ SUSP
INTRAMUSCULAR | Status: DC | PRN
  Administered 2024-05-17: 20 mL

## 2024-05-17 MED ORDER — SENNA 8.6 MG PO TABS
1.0000 | ORAL_TABLET | Freq: Two times a day (BID) | ORAL | Status: DC | PRN
  Administered 2024-05-17: 8.6 mg via ORAL
  Filled 2024-05-17: qty 1

## 2024-05-17 MED ORDER — OXYCODONE HCL 5 MG PO TABS: 10.0000 mg | ORAL_TABLET | ORAL | Status: DC | PRN

## 2024-05-17 MED ORDER — ONDANSETRON HCL 4 MG/2ML IJ SOLN
INTRAMUSCULAR | Status: AC
  Filled 2024-05-17: qty 2

## 2024-05-17 MED ORDER — CELECOXIB 200 MG PO CAPS
ORAL_CAPSULE | ORAL | Status: AC
  Filled 2024-05-17: qty 1

## 2024-05-17 MED ORDER — ROCURONIUM BROMIDE 10 MG/ML (PF) SYRINGE
PREFILLED_SYRINGE | INTRAVENOUS | Status: AC
  Filled 2024-05-17: qty 10

## 2024-05-17 MED ORDER — SODIUM CHLORIDE 0.9% FLUSH
3.0000 mL | INTRAVENOUS | Status: DC | PRN
Start: 2024-05-17 — End: 2024-05-18

## 2024-05-17 MED ORDER — FENTANYL CITRATE (PF) 250 MCG/5ML IJ SOLN
INTRAMUSCULAR | Status: AC
  Filled 2024-05-17: qty 5

## 2024-05-17 MED ORDER — THROMBIN 5000 UNITS EX SOLR
OROMUCOSAL | Status: DC | PRN
  Administered 2024-05-17: 5 mL via TOPICAL

## 2024-05-17 MED ORDER — ACETAMINOPHEN 500 MG PO TABS
1000.0000 mg | ORAL_TABLET | Freq: Four times a day (QID) | ORAL | Status: DC
  Administered 2024-05-17 – 2024-05-18 (×3): 1000 mg via ORAL
  Filled 2024-05-17 (×3): qty 2

## 2024-05-17 MED ORDER — ONDANSETRON HCL 4 MG/2ML IJ SOLN: 4.0000 mg | Freq: Four times a day (QID) | INTRAMUSCULAR | Status: DC | PRN

## 2024-05-17 MED ORDER — INSULIN ASPART 100 UNIT/ML IJ SOLN
0.0000 [IU] | Freq: Three times a day (TID) | INTRAMUSCULAR | Status: DC
  Administered 2024-05-17: 8 [IU] via SUBCUTANEOUS

## 2024-05-17 SURGICAL SUPPLY — 62 items
BAG COUNTER SPONGE SURGICOUNT (BAG) ×1 IMPLANT
BASKET BONE COLLECTION (BASKET) ×1 IMPLANT
BENZOIN TINCTURE PRP APPL 2/3 (GAUZE/BANDAGES/DRESSINGS) ×1 IMPLANT
BLADE CLIPPER SURG (BLADE) IMPLANT
BUR MATCHSTICK NEURO 3.0 LAGG (BURR) ×1 IMPLANT
BUR PRECISION FLUTE 6.0 (BURR) ×1 IMPLANT
CAGE ALTERA 10X31X9-13 15D (Cage) IMPLANT
CANISTER SUCTION 3000ML PPV (SUCTIONS) ×1 IMPLANT
CAP LOCK DLX THRD (Cap) IMPLANT
CATH COUDE FOLEY 5CC 14FR (CATHETERS) IMPLANT
CLEANSER WND VASHE INSTL 34OZ (WOUND CARE) ×1 IMPLANT
CNTNR URN SCR LID CUP LEK RST (MISCELLANEOUS) ×1 IMPLANT
COVER BACK TABLE 60X90IN (DRAPES) ×1 IMPLANT
DRAPE C-ARM 42X72 X-RAY (DRAPES) ×2 IMPLANT
DRAPE HALF SHEET 40X57 (DRAPES) ×1 IMPLANT
DRAPE LAPAROTOMY 100X72X124 (DRAPES) ×1 IMPLANT
DRAPE SURG 17X23 STRL (DRAPES) ×1 IMPLANT
DRSG OPSITE POSTOP 4X6 (GAUZE/BANDAGES/DRESSINGS) ×1 IMPLANT
ELECTRODE BLDE 4.0 EZ CLN MEGD (MISCELLANEOUS) ×1 IMPLANT
ELECTRODE REM PT RTRN 9FT ADLT (ELECTROSURGICAL) ×1 IMPLANT
EVACUATOR 1/8 PVC DRAIN (DRAIN) IMPLANT
GAUZE 4X4 16PLY ~~LOC~~+RFID DBL (SPONGE) ×1 IMPLANT
GLOVE BIO SURGEON STRL SZ 6 (GLOVE) ×1 IMPLANT
GLOVE BIO SURGEON STRL SZ8 (GLOVE) ×2 IMPLANT
GLOVE BIO SURGEON STRL SZ8.5 (GLOVE) ×2 IMPLANT
GLOVE BIOGEL PI IND STRL 6.5 (GLOVE) ×1 IMPLANT
GLOVE EXAM NITRILE XL STR (GLOVE) IMPLANT
GOWN STRL REUS W/ TWL LRG LVL3 (GOWN DISPOSABLE) ×1 IMPLANT
GOWN STRL REUS W/ TWL XL LVL3 (GOWN DISPOSABLE) ×2 IMPLANT
GOWN STRL REUS W/TWL 2XL LVL3 (GOWN DISPOSABLE) IMPLANT
HEMOSTAT POWDER KIT SURGIFOAM (HEMOSTASIS) ×1 IMPLANT
KIT BASIN OR (CUSTOM PROCEDURE TRAY) ×1 IMPLANT
KIT GRAFTMAG DEL NEURO DISP (NEUROSURGERY SUPPLIES) IMPLANT
KIT POSITION SURG JACKSON T1 (MISCELLANEOUS) ×1 IMPLANT
KIT TURNOVER KIT B (KITS) ×1 IMPLANT
NDL HYPO 21X1.5 SAFETY (NEEDLE) ×1 IMPLANT
NDL HYPO 22X1.5 SAFETY MO (MISCELLANEOUS) ×1 IMPLANT
NEEDLE HYPO 21X1.5 SAFETY (NEEDLE) ×1 IMPLANT
NEEDLE HYPO 22X1.5 SAFETY MO (MISCELLANEOUS) ×1 IMPLANT
NS IRRIG 1000ML POUR BTL (IV SOLUTION) ×1 IMPLANT
PACK LAMINECTOMY NEURO (CUSTOM PROCEDURE TRAY) ×1 IMPLANT
PAD ARMBOARD POSITIONER FOAM (MISCELLANEOUS) ×3 IMPLANT
PATTIES SURGICAL .5 X.5 (GAUZE/BANDAGES/DRESSINGS) ×1 IMPLANT
PATTIES SURGICAL .5 X1 (DISPOSABLE) IMPLANT
PATTIES SURGICAL 1X1 (DISPOSABLE) ×1 IMPLANT
PUTTY DBM 10CC CALC GRAN (Putty) IMPLANT
ROD CREO DLX CVD 6.35X40 (Rod) IMPLANT
SCREW PA DLX CREO 7.5X50 (Screw) IMPLANT
SCREW PA DLX CREO 7.5X55 (Screw) IMPLANT
SPIKE FLUID TRANSFER (MISCELLANEOUS) ×1 IMPLANT
SPONGE NEURO XRAY DETECT 1X3 (DISPOSABLE) IMPLANT
SPONGE SURGIFOAM ABS GEL 100 (HEMOSTASIS) IMPLANT
SPONGE T-LAP 4X18 ~~LOC~~+RFID (SPONGE) IMPLANT
STRIP CLOSURE SKIN 1/2X4 (GAUZE/BANDAGES/DRESSINGS) ×1 IMPLANT
SUT VIC AB 1 CT1 18XBRD ANBCTR (SUTURE) ×2 IMPLANT
SUT VIC AB 2-0 CP2 18 (SUTURE) ×2 IMPLANT
SYR 20ML LL LF (SYRINGE) IMPLANT
SYR 30ML SLIP (SYRINGE) ×1 IMPLANT
TOWEL GREEN STERILE (TOWEL DISPOSABLE) ×1 IMPLANT
TOWEL GREEN STERILE FF (TOWEL DISPOSABLE) ×1 IMPLANT
TRAY FOLEY MTR SLVR 16FR STAT (SET/KITS/TRAYS/PACK) ×1 IMPLANT
WATER STERILE IRR 1000ML POUR (IV SOLUTION) ×1 IMPLANT

## 2024-05-17 NOTE — H&P (Signed)
 Subjective: The patient is a 63 year old white male who is complaining of back and left leg pain consistent with lumbar radiculopathy/neurogenic claudication.  He has failed medical management was worked up with a lumbar MRI and lumbar x-rays which demonstrated an L4-5 small listhesis and spinal stenosis.  I discussed the various treatment options with him.  He has decided to proceed with surgery.  Past Medical History:  Diagnosis Date   AK (actinic keratosis)    Arthritis    Basal cell carcinoma 02/01/2017   R temple approx 2.0 cm post to lat brow   DDD (degenerative disc disease), lumbar    DM (diabetes mellitus) (HCC)    Hyperlipidemia    Hypertension    Lumbar radiculitis    Lumbar radiculopathy    Nodular prostate     Past Surgical History:  Procedure Laterality Date   COLONOSCOPY N/A 11/29/2015   Procedure: COLONOSCOPY;  Surgeon: Cassie Click, MD;  Location[facility]COPY;  Service: Endoscopy;  Laterality: N/A;   HERNIA REPAIR     SHOULDER SURGERY Right    VASECTOMY  10/17/2021    No Known Allergies  Social History   Tobacco Use   Smoking status: Never   Smokeless tobacco: Former    Types: Chew  Substance Use Topics   Alcohol use: Not Currently    Family History  Problem Relation Age of Onset   Hematuria Neg Hx    Prostate cancer Neg Hx    Prior to Admission medications   Medication Sig Start Date End Date Taking? Authorizing Provider  lisinopril -hydrochlorothiazide  (ZESTORETIC ) 20-25 MG tablet TAKE 1 TABLET BY MOUTH DAILY 02/01/24  Yes Nahser, Lela Purple, MD  rosuvastatin  (CRESTOR ) 20 MG tablet Take 1 tablet (20 mg total) by mouth daily. 10/28/23  Yes Swinyer, Leilani Punter, NP  traMADol (ULTRAM) 50 MG tablet Take 50 mg by mouth every 6 (six) hours as needed for severe pain (pain score 7-10).   Yes [provider]  aspirin EC 81 MG tablet Take 81 mg by mouth daily.    [provider]  tadalafil  (CIALIS ) 20 MG tablet Take 1 tab 1 hour prior to  intercourse 10/28/22   Geraline Knapp, MD     Review of Systems  Positive ROS: As above  All other systems have been reviewed and were otherwise negative with the exception of those mentioned in the HPI and as above.  Objective: Vital signs in last 24 hours: Temp:  [98.1 F (36.7 C)] 98.1 F (36.7 C) (06/04 0981) Pulse Rate:  [83] 83 (06/04 0633) Resp:  [18] 18 (06/04 0633) BP: (136)/(89) 136/89 (06/04 0633) SpO2:  [96 %] 96 % (06/04 1914) Weight:  [86.2 kg] 86.2 kg (06/04 0633) Estimated body mass index is 29.76 kg/m as calculated from the following:   Height as of this encounter: 5\' 7"  (1.702 m).   Weight as of this encounter: 86.2 kg.   General Appearance: Alert Head: Normocephalic, without obvious abnormality, atraumatic Eyes: PERRL, conjunctiva/corneas clear, EOM's intact,    Ears: Normal  Throat: Normal  Neck: Supple, Back: unremarkable Lungs: Clear to auscultation bilaterally, respirations unlabored Heart: Regular rate and rhythm, no murmur, rub or gallop Abdomen: Soft, non-tender Extremities: Extremities normal, atraumatic, no cyanosis or edema Skin: unremarkable  NEUROLOGIC:   Mental status: alert and oriented,Motor Exam - grossly normal Sensory Exam - grossly normal Reflexes:  Coordination - grossly normal Gait - grossly normal Balance - grossly normal Cranial Nerves: I: smell Not tested  II: visual acuity  OS: Normal  OD: Normal   II: visual fields Full to confrontation  II: pupils Equal, round, reactive to light  III,VII: ptosis None  III,IV,VI: extraocular muscles  Full ROM  V: mastication Normal  V: facial light touch sensation  Normal  V,VII: corneal reflex  Present  VII: facial muscle function - upper  Normal  VII: facial muscle function - lower Normal  VIII: hearing Not tested  IX: soft palate elevation  Normal  IX,X: gag reflex Present  XI: trapezius strength  5/5  XI: sternocleidomastoid strength 5/5  XI: neck flexion strength  5/5   XII: tongue strength  Normal    Data Review Lab Results  Component Value Date   WBC 11.3 (H) 05/04/2024   HGB 14.7 05/04/2024   HCT 42.2 05/04/2024   MCV 91.9 05/04/2024   PLT 242 05/04/2024   Lab Results  Component Value Date   NA 136 05/04/2024   K 4.7 05/04/2024   CL 102 05/04/2024   CO2 26 05/04/2024   BUN 16 05/04/2024   CREATININE 0.97 05/04/2024   GLUCOSE 149 (H) 05/04/2024   No results found for: "INR", "PROTIME"  Assessment/Plan: Lumbar spinal stenosis, lumbar spinal stenosis, lumbar radiculopathy, neurogenic claudication, lumbago: I have discussed the situation with the patient and his wife.  I reviewed his imaging studies with him and pointed out the abnormalities.  We have discussed the various treatment options including surgery.  I have described the surgical treatment option of an L4-5 decompression, instrumentation and fusion.  I have shown him surgical models.  I have given him a surgical pamphlet.  We have discussed the risk, benefits, alternatives, expected postoperative course, and likelihood of achieving our goals with surgery.  I have answered all the patient's questions.  He has decided proceed with surgery.   Elder Greening 05/17/2024 7:41 AM

## 2024-05-17 NOTE — Progress Notes (Signed)
 Orthopedic Tech Progress Note Patient Details:  Jorge Grant [birth year] [id number]  Per PACU RN wife has patient back brace   Patient ID: Jorge Grant, male   DOB: Sep 28, 1961, 63 y.o.   MRN: [id number]  Jorge Grant 05/17/2024, 2:32 PM

## 2024-05-17 NOTE — Transfer of Care (Signed)
 Immediate Anesthesia Transfer of Care Note  Patient: Jorge Grant  Procedure(s) Performed: POSTERIOR LUMBAR FUSION 1 LEVEL  Patient Location: PACU  Anesthesia Type:General  Level of Consciousness: awake, alert , and oriented  Airway & Oxygen Therapy: Patient Spontanous Breathing  Post-op Assessment: Report given to RN and Post -op Vital signs reviewed and stable  Post vital signs: Reviewed and stable  Last Vitals:  Vitals Value Taken Time  BP    Temp    Pulse 95 05/17/24 1225  Resp 22 05/17/24 1225  SpO2 98 % 05/17/24 1225  Vitals shown include unfiled device data.  Last Pain:  Vitals:   05/17/24 0652  TempSrc:   PainSc: 4          Complications: No notable events documented.

## 2024-05-17 NOTE — Op Note (Signed)
 Brief history: The patient is a 63 year old white male who is complaining of back and left leg pain consistent with lumbar radiculopathy/neurogenic claudication.  He has failed medical management and was worked up with a lumbar MRI and lumbar x-rays which demonstrated multilevel degenerative changes with a spondylolisthesis and spinal stenosis most prominent at L4-5.  I discussed the various treatment options with him.  He has decided proceed with surgery.  Preoperative diagnosis: L4-5 spondylolisthesis, degenerative disc disease, spinal stenosis compressing both the L4 and the L5 nerve roots; lumbago; lumbar radiculopathy; neurogenic claudication  Postoperative diagnosis: The same  Procedure: Bilateral L4-5 laminotomy/foraminotomies/medial facetectomy to decompress the bilateral L4 and L5 nerve roots(the work required to do this was in addition to the work required to do the posterior lumbar interbody fusion because of the patient's spinal stenosis, facet arthropathy. Etc. requiring a wide decompression of the nerve roots.); left L4-5 transforaminal lumbar interbody fusion with local morselized autograft bone and Zimmer DBM; insertion of interbody prosthesis at L4-5 (globus peek expandable interbody prosthesis); posterior nonsegmental instrumentation from L4 to L5 with globus titanium pedicle screws and rods; posterior lateral arthrodesis at L4-5 with local morselized autograft bone and Zimmer DBM.  Surgeon: Dr. Pleasant Brilliant  Asst.: Marlana Silvan, NP  Anesthesia: Gen. endotracheal  Estimated blood loss: 150 cc  Drains: 1 medium Hemovac drain in the epidural space  Complications: None  Description of procedure: The patient was brought to the operating room by the anesthesia team. General endotracheal anesthesia was induced. The patient was turned to the prone position on the Wilson frame. The patient's lumbosacral region was then prepared with Betadine scrub and Betadine solution. Sterile drapes  were applied.  I then injected the area to be incised with Marcaine with epinephrine solution. I then used the scalpel to make a linear midline incision over the L4-5 interspace. I then used electrocautery to perform a bilateral subperiosteal dissection exposing the spinous process and lamina of L4-5. We then obtained intraoperative radiograph to confirm our location. We then inserted the Verstrac retractor to provide exposure.  I began the decompression by using the high speed drill to perform laminotomies at L4-5 bilaterally. We then used the Kerrison punches to widen the laminotomy and removed the ligamentum flavum at L4-5 bilaterally. We used the Kerrison punches to remove the medial facets at L4-5 bilaterally, we removed the left L4-5 facet. We performed wide foraminotomies about the bilateral L4 and L5 nerve roots completing the decompression.  We now turned our attention to the posterior lumbar interbody fusion. I used a scalpel to incise the intervertebral disc at L4-5 bilaterally. I then performed a partial intervertebral discectomy at L4-5 bilaterally using the pituitary forceps. We prepared the vertebral endplates at L4-5 bilaterally for the fusion by removing the soft tissues with the curettes. We then used the trial spacers to pick the appropriate sized interbody prosthesis. We prefilled his prosthesis with a combination of local morselized autograft bone that we obtained during the decompression as well as Zimmer DBM. We inserted the prefilled prosthesis into the interspace at L4-5 from the left, we then turned and expanded the prosthesis. There was a good snug fit of the prosthesis in the interspace. We then filled and the remainder of the intervertebral disc space with local morselized autograft bone and Zimmer DBM. This completed the posterior lumbar interbody arthrodesis.  During the decompression and insertion of the prosthesis the assistant protected the thecal sac and nerve roots with the  D'Errico retractor.  We now turned attention  to the instrumentation. Under fluoroscopic guidance we cannulated the bilateral L4 and L5 pedicles with the bone probe. We then removed the bone probe. We then tapped the pedicle with a 6.5 millimeter tap. We then removed the tap. We probed inside the tapped pedicle with a ball probe to rule out cortical breaches. We then inserted a 7.5 x 50 and 55 millimeter pedicle screw into the L4 and L5 pedicles bilaterally under fluoroscopic guidance. We then palpated along the medial aspect of the pedicles to rule out cortical breaches. There were none. The nerve roots were not injured. We then connected the unilateral pedicle screws with a lordotic rod. We compressed the construct and secured the rod in place with the caps. We then tightened the caps appropriately. This completed the instrumentation from L4-5.  We now turned our attention to the posterior lateral arthrodesis at L4-5. We used the high-speed drill to decorticate the remainder of the facets, pars, transverse process at L4-5. We then applied a combination of local morselized autograft bone and Zimmer DBM over these decorticated posterior lateral structures. This completed the posterior lateral arthrodesis.  We then obtained hemostasis using bipolar electrocautery. We irrigated the wound out with vashe solution. We inspected the thecal sac and nerve roots and noted they were well decompressed. We then removed the retractor.  We injected Exparel .  We placed a medium Hemovac drain in the epidural space and tunneled it out through a separate stab wound.  We reapproximated patient's thoracolumbar fascia with interrupted #1 Vicryl suture. We reapproximated patient's subcutaneous tissue with interrupted 2-0 Vicryl suture. The reapproximated patient's skin with Steri-Strips and benzoin. The wound was then coated with bacitracin ointment. A sterile dressing was applied. The drapes were removed. The patient was  subsequently returned to the supine position where they were extubated by the anesthesia team. He was then transported to the post anesthesia care unit in stable condition. All sponge instrument and needle counts were reportedly correct at the end of this case.

## 2024-05-17 NOTE — Anesthesia Procedure Notes (Signed)
 Procedure Name: Intubation Date/Time: 05/17/2024 8:42 AM  Performed by: Linard Reno, CRNAPre-anesthesia Checklist: Patient identified, Emergency Drugs available, Suction available and Patient being monitored Patient Re-evaluated:Patient Re-evaluated prior to induction Oxygen Delivery Method: Circle System Utilized Preoxygenation: Pre-oxygenation with 100% oxygen Induction Type: IV induction Ventilation: Mask ventilation without difficulty and Oral airway inserted - appropriate to patient size Laryngoscope Size: Mac and 3 Grade View: Grade I Tube type: Oral Tube size: 7.5 mm Number of attempts: 1 Airway Equipment and Method: Stylet and Oral airway Placement Confirmation: ETT inserted through vocal cords under direct vision, positive ETCO2 and breath sounds checked- equal and bilateral Secured at: 23 cm Tube secured with: Tape Dental Injury: Teeth and Oropharynx as per pre-operative assessment

## 2024-05-18 DIAGNOSIS — M4316 Spondylolisthesis, lumbar region: Secondary | ICD-10-CM | POA: Diagnosis not present

## 2024-05-18 LAB — GLUCOSE, CAPILLARY: Glucose-Capillary: 119 mg/dL — ABNORMAL HIGH (ref 70–99)

## 2024-05-18 MED ORDER — CYCLOBENZAPRINE HCL 10 MG PO TABS: 10.0000 mg | ORAL_TABLET | Freq: Three times a day (TID) | ORAL | 0 refills | Status: DC | PRN

## 2024-05-18 MED ORDER — DOCUSATE SODIUM 100 MG PO CAPS: 100.0000 mg | ORAL_CAPSULE | Freq: Two times a day (BID) | ORAL | 0 refills | Status: DC

## 2024-05-18 MED ORDER — OXYCODONE-ACETAMINOPHEN 5-325 MG PO TABS: 1.0000 | ORAL_TABLET | ORAL | Status: DC | PRN

## 2024-05-18 MED ORDER — OXYCODONE-ACETAMINOPHEN 5-325 MG PO TABS: 1.0000 | ORAL_TABLET | ORAL | 0 refills | Status: DC | PRN

## 2024-05-18 MED FILL — Heparin Sodium (Porcine) Inj 1000 Unit/ML: INTRAMUSCULAR | Qty: 30 | Status: AC

## 2024-05-18 MED FILL — Sodium Chloride IV Soln 0.9%: INTRAVENOUS | Qty: 2000 | Status: AC

## 2024-05-18 NOTE — Progress Notes (Signed)
 Patient alert and oriented, ambulate, void, VSS. Surgical site clean and dry no sign of infection. D/c instructions explain and given to the patient all questions answered.

## 2024-05-18 NOTE — Discharge Summary (Signed)
 Physician Discharge Summary  Patient ID: Jorge Grant MRN: [id number] DOB/AGE: [birth year] 63 y.o.  Admit date: 05/17/2024 Discharge date: 05/18/2024  Admission Diagnoses: Lumbar spondylolisthesis, lumbar facet arthropathy, lumbar spinal stenosis, lumbar radiculopathy, neurogenic claudication  Discharge Diagnoses: The same Principal Problem:   Spondylolisthesis of lumbar region   Discharged Condition: good  Hospital Course: I performed an L4-5 decompression, instrumentation and fusion on the patient on 05/17/2024.  The surgery went well.  The patient's postoperative course was unremarkable.  On postoperative day #1 he requested discharge to home.  The patient, and his wife, were given verbal and written discharge instructions.  All their questions were answered.  Consults: PT, care management Significant Diagnostic Studies: None Treatments: L4-5 decompression, instrumentation and fusion. Discharge Exam: Blood pressure (!) 111/57, pulse 84, temperature 98.8 F (37.1 C), temperature source Oral, resp. rate 17, height 5\' 7"  (1.702 m), weight 86.2 kg, SpO2 97%. The patient is alert and pleasant.  He looks well.  His strength is normal.  Disposition: Home  Discharge Instructions     Call MD for:  difficulty breathing, headache or visual disturbances   Complete by: As directed    Call MD for:  extreme fatigue   Complete by: As directed    Call MD for:  hives   Complete by: As directed    Call MD for:  persistant dizziness or light-headedness   Complete by: As directed    Call MD for:  persistant nausea and vomiting   Complete by: As directed    Call MD for:  redness, tenderness, or signs of infection (pain, swelling, redness, odor or green/yellow discharge around incision site)   Complete by: As directed    Call MD for:  severe uncontrolled pain   Complete by: As directed    Call MD for:  temperature >100.4   Complete by: As directed    Diet - low sodium heart healthy   Complete  by: As directed    Discharge instructions   Complete by: As directed    Call [phone] for a followup appointment. Take a stool softener while you are using pain medications.   Driving Restrictions   Complete by: As directed    Do not drive for 2 weeks.   Increase activity slowly   Complete by: As directed    Lifting restrictions   Complete by: As directed    Do not lift more than 5 pounds. No excessive bending or twisting.   May shower / Bathe   Complete by: As directed    Remove the dressing for 3 days after surgery.  You may shower, but leave the incision alone.   Remove dressing in 48 hours   Complete by: As directed       Allergies as of 05/18/2024   No Known Allergies      Medication List     STOP taking these medications    traMADol 50 MG tablet Commonly known as: ULTRAM       TAKE these medications    aspirin EC 81 MG tablet Take 81 mg by mouth daily.   cyclobenzaprine 10 MG tablet Commonly known as: FLEXERIL Take 1 tablet (10 mg total) by mouth 3 (three) times daily as needed for muscle spasms.   docusate sodium 100 MG capsule Commonly known as: COLACE Take 1 capsule (100 mg total) by mouth 2 (two) times daily.   lisinopril -hydrochlorothiazide  20-25 MG tablet Commonly known as: ZESTORETIC  TAKE 1 TABLET BY MOUTH DAILY   oxyCODONE-acetaminophen   5-325 MG tablet Commonly known as: PERCOCET/ROXICET Take 1-2 tablets by mouth every 4 (four) hours as needed for moderate pain (pain score 4-6).   rosuvastatin  20 MG tablet Commonly known as: CRESTOR  Take 1 tablet (20 mg total) by mouth daily.   tadalafil  20 MG tablet Commonly known as: CIALIS  Take 1 tab 1 hour prior to intercourse         Signed: Elder Greening 05/18/2024, 10:19 AM

## 2024-05-18 NOTE — Evaluation (Signed)
 Physical Therapy Evaluation  Patient Details Name: Jorge Grant MRN: [id number] DOB: 1961/07/27 Today's Date: 05/18/2024  History of Present Illness  Pt is a 63 y/o male who presents s/p L4-L5 PLIF on 05/17/2024. PMH significant for DM, HTN, skin CA, R shoulder surgery.  Clinical Impression  Pt admitted with above diagnosis. At the time of PT eval, pt was able to demonstrate transfers and ambulation with gross supervision for safety and no AD. Pt was educated on precautions, brace application/wearing schedule, appropriate activity progression, and car transfer. Pt currently with functional limitations due to the deficits listed below (see PT Problem List). Pt will benefit from skilled PT to increase their independence and safety with mobility to allow discharge to the venue listed below.          If plan is discharge home, recommend the following: A little help with walking and/or transfers;A little help with bathing/dressing/bathroom;Assistance with cooking/housework;Assist for transportation;Help with stairs or ramp for entrance   Can travel by private vehicle        Equipment Recommendations None recommended by PT  Recommendations for Other Services       Functional Status Assessment Patient has had a recent decline in their functional status and demonstrates the ability to make significant improvements in function in a reasonable and predictable amount of time.     Precautions / Restrictions Precautions Precautions: Fall;Back Precaution Booklet Issued: Yes (comment) Recall of Precautions/Restrictions: Intact Precaution/Restrictions Comments: Reviewed handout and pt was cued for precautions during functional mobility. Required Braces or Orthoses: Spinal Brace Spinal Brace: Lumbar corset;Applied in sitting position Restrictions Weight Bearing Restrictions Per Provider Order: No      Mobility  Bed Mobility Overal bed mobility: Needs Assistance Bed Mobility: Rolling, Sidelying  to Sit Rolling: Supervision Sidelying to sit: Supervision       General bed mobility comments: HOB flat and rails lowered to simulate home environment. No assist required    Transfers Overall transfer level: Needs assistance Equipment used: None Transfers: Sit to/from Stand Sit to Stand: Supervision           General transfer comment: Light supervision for safety as pt powered up to full stand. No gross unsteadiness or LOB noted.    Ambulation/Gait Ambulation/Gait assistance: Supervision Gait Distance (Feet): 500 Feet Assistive device: None Gait Pattern/deviations: Step-through pattern, Decreased stride length, Trunk flexed Gait velocity: Decreased Gait velocity interpretation: 1.31 - 2.62 ft/sec, indicative of limited community ambulator   General Gait Details: Pt demonstrating good posture throughout session. No overt LOB noted. Pt reports distance ambulated is significantly improved compared to prior to surgery.  Stairs Stairs: Yes Stairs assistance: Contact guard assist Stair Management: One rail Right, Step to pattern, Forwards Number of Stairs: 10 General stair comments: VC's for sequencing and general safety.  Wheelchair Mobility     Tilt Bed    Modified Rankin (Stroke Patients Only)       Balance                                             Pertinent Vitals/Pain Pain Assessment Pain Assessment: Faces Faces Pain Scale: Hurts little more Pain Location: back Pain Descriptors / Indicators: Operative site guarding, Sore Pain Intervention(s): Limited activity within patient's tolerance, Monitored during session, Repositioned    Home Living Family/patient expects to be discharged to:: Private residence Living Arrangements: Spouse/significant other Available Help at  Discharge: Family;Available 24 hours/day Type of Home: House Home Access: Stairs to enter       Home Layout: One level Home Equipment: Shower seat - built in       Prior Function Prior Level of Function : Independent/Modified Independent             Mobility Comments: Very limited by pain for the last 3 months. Distance limited to household distances only. ADLs Comments: Wife assisting to don shoes/socks. Pt independent with showering     Extremity/Trunk Assessment   Upper Extremity Assessment Upper Extremity Assessment: Overall WFL for tasks assessed    Lower Extremity Assessment Lower Extremity Assessment: Generalized weakness (Mild; consistent with pre-op diagnosis)    Cervical / Trunk Assessment Cervical / Trunk Assessment: Back Surgery  Communication   Communication Communication: No apparent difficulties    Cognition Arousal: Alert Behavior During Therapy: WFL for tasks assessed/performed   PT - Cognitive impairments: No apparent impairments                         Following commands: Intact       Cueing Cueing Techniques: Verbal cues, Gestural cues     General Comments      Exercises     Assessment/Plan    PT Assessment Patient needs continued PT services  PT Problem List Decreased strength;Decreased activity tolerance;Decreased mobility;Decreased balance;Decreased knowledge of use of DME;Decreased safety awareness;Decreased knowledge of precautions;Pain       PT Treatment Interventions DME instruction;Gait training;Stair training;Functional mobility training;Therapeutic activities;Therapeutic exercise;Balance training;Patient/family education    PT Goals (Current goals can be found in the Care Plan section)  Acute Rehab PT Goals Patient Stated Goal: Home today PT Goal Formulation: With patient/family Time For Goal Achievement: 05/25/24 Potential to Achieve Goals: Good    Frequency Min 5X/week     Co-evaluation               AM-PAC PT "6 Clicks" Mobility  Outcome Measure Help needed turning from your back to your side while in a flat bed without using bedrails?: A Little Help needed  moving from lying on your back to sitting on the side of a flat bed without using bedrails?: A Little Help needed moving to and from a bed to a chair (including a wheelchair)?: A Little Help needed standing up from a chair using your arms (e.g., wheelchair or bedside chair)?: A Little Help needed to walk in hospital room?: A Little Help needed climbing 3-5 steps with a railing? : A Little 6 Click Score: 18    End of Session Equipment Utilized During Treatment: Back brace;Gait belt Activity Tolerance: Patient tolerated treatment well Patient left: in bed;with call bell/phone within reach;with family/visitor present Nurse Communication: Mobility status PT Visit Diagnosis: Unsteadiness on feet (R26.81);Pain    Time: 1026-1049 PT Time Calculation (min) (ACUTE ONLY): 23 min   Charges:   PT Evaluation $PT Eval Low Complexity: 1 Low PT Treatments $Gait Training: 8-22 mins PT General Charges $$ ACUTE PT VISIT: 1 Visit         Simone Dubois, PT, DPT Acute Rehabilitation Services Secure Chat Preferred Office: [phone]   Venus Ginsberg 05/18/2024, 2:09 PM

## 2024-05-18 NOTE — Anesthesia Postprocedure Evaluation (Signed)
 Anesthesia Post Note  Patient: Jorge Grant  Procedure(s) Performed: POSTERIOR LUMBAR FUSION 1 LEVEL     Patient location during evaluation: PACU Anesthesia Type: General Level of consciousness: sedated and patient cooperative Pain management: pain level controlled Vital Signs Assessment: post-procedure vital signs reviewed and stable Respiratory status: spontaneous breathing Cardiovascular status: stable Anesthetic complications: no   No notable events documented.  Last Vitals:  Vitals:   05/18/24 0437 05/18/24 0823  BP: 103/61 (!) 111/57  Pulse: 77 84  Resp: 18 17  Temp: 36.9 C 37.1 C  SpO2: 98% 97%    Last Pain:  Vitals:   05/18/24 0923  TempSrc:   PainSc: 3                  Gorman Laughter

## 2024-10-04 ENCOUNTER — Ambulatory Visit: Payer: Commercial Managed Care - PPO | Admitting: Dermatology

## 2024-10-10 NOTE — Progress Notes (Unsigned)
 Cardiology Office Note   Date:  10/11/2024  ID:  Jorge Grant, DOB 11-24-1961, MRN [id number] PCP: Valora Lynwood FALCON, MD  [facility] Providers Cardiologist:  Aleene Passe, MD (Inactive)     History of Present Illness Jorge Grant is a 63 y.o. male with past medical history of coronary artery calcification, hypertension, hyperlipidemia, family history of early coronary artery disease, borderline diabetes, is here today for referral from his PCP for his hypertension.   Previously been referred to cardiology and seen by Dr. Passe for elevated calcium  score in 11/02/2022.  Coronary calcium  score ordered by PCP 09/2022 was in 1.1 which was the 96 percentile.  Repeat CAC score 1329 and 98 percentile.  Significant family history with father died from an MI at the age of 86, brother had MI at the age of 77, another brother had CABG at age 72.  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  Walks 1 to 2 miles twice a week at baseline.  LP(a) was normal.  Had previously been on rosuvastatin  20 mg daily.  He had followed up with Dr. Passe 02/05/2023 LP(a) was 46.  Blood pressure was mildly elevated and lisinopril  was changed to lisinopril /HCTZ 20/25 mg daily.  He was asymptomatic at that time was advised to return in 2 months for follow-up.  He was evaluated in 05/03/2023.  Reported feeling great walking 2 miles daily.  Reported more energy than he did in the past.  He was cooking most meals at home and not adding salt but admits occasional eating bacon and steak.  He was last seen in clinic 10/28/2023 at that time reported blood pressures consistently in the mid 130s over 60s to 70s.  Consistently taking antihypertensive medications.  Was admitted to poor diet recently especially after running trip to [place]  where he indulged in a lot more meat and sweet foods lead to weight gain 5 pounds.  He acknowledged needing to improve his diet and lose weight.  He had a weight increase  of approximately 10 pounds since May 2024.  There were no medication changes that were made and no further testing that was needed at that time.  He was to follow-up in 1 year.   He presents to clinic today to reestablish care.  He states perspective he has been doing well.  He denies any chest pain, shortness of breath, peripheral edema.  States that he previously had had back surgery and has rods in his back as he has had low back pain.  States that he has a walking approximately 2 miles a day and continues to hunt.  He is preparing for his annual trip to [place]  on a hunting trip.  He does monitor his blood pressures at home and blood pressures run 120-130 systolic and 60-70 diastolic.  Chart review reveals blood pressure at his PCP appointment was in the 120s.  States that when he arrives at typical appointments his blood pressure is slightly elevated.  States he has been compliant with his current medication regimen without any undue side effects.  Denies any hospitalizations or visits to the emergency department.  ROS: 10 point review of system has been reviewed and considered negative with exception of what is listed in the HPI  Studies Reviewed EKG Interpretation Date/Time:  Wednesday October 11 2024 08:14:56 EDT Ventricular Rate:  80 PR Interval:  130 QRS Duration:  74 QT Interval:  360 QTC Calculation: 415 R Axis:   35  Text  Interpretation: Normal sinus rhythm Normal ECG When compared with ECG of 28-Oct-2023 07:51, No significant change was found Confirmed by Gerard Frederick ([id number]) on 10/11/2024 8:20:05 AM    cCTA 10/12/2022 IMPRESSION AND RECOMMENDATION: 1. Coronary calcium  score of 1329. This was 98th percentile for age and sex matched control.   2. CAC >300 in LAD, LCx, RCA. CAC-DRS A3/N3.   3. Recommend aspirin and statin if no contraindication.   4. Recommend cardiology consultation.   5. Continue heart healthy lifestyle and risk factor modification. Risk  Assessment/Calculations   HYPERTENSION CONTROL Vitals:   10/11/24 0807 10/11/24 0812  BP: (!) 150/80 (!) 150/78    The patient's blood pressure is elevated above target today.  In order to address the patient's elevated BP: The blood pressure is usually elevated in clinic.  Blood pressures monitored at home have been optimal.; Blood pressure will be monitored at home to determine if medication changes need to be made.          Physical Exam VS:  BP (!) 150/78 (BP Location: Right Arm, Patient Position: Sitting, Cuff Size: Normal)   Pulse 80   Ht 5' 7 (1.702 m)   Wt 194 lb 4 oz (88.1 kg)   SpO2 95%   BMI 30.42 kg/m        Wt Readings from Last 3 Encounters:  10/11/24 194 lb 4 oz (88.1 kg)  05/17/24 190 lb (86.2 kg)  05/04/24 192 lb 6.4 oz (87.3 kg)    GEN: Well nourished, well developed in no acute distress NECK: No JVD; No carotid bruits CARDIAC: RRR, no murmurs, rubs, gallops RESPIRATORY:  Clear to auscultation without rales, wheezing or rhonchi  ABDOMEN: Soft, non-tender, non-distended EXTREMITIES:  No edema; No deformity   ASSESSMENT AND PLAN Coronary artery disease without angina.  Previously had elevated coronary calcium  score of 1329 which was 98 percentile on CT.  He continues to remain physically active and denies any chest pain, dyspnea, or other concerning symptoms for angina.  EKG today reveals sinus rhythm with a rate of 88 with no ischemic changes noted.  LDL continues to remain at goal.  No further indication for ischemic evaluation at this time.  He is continued on aspirin 81 mg daily and rosuvastatin  20 mg daily.  He is encouraged to continue with his activity.   Primary hypertension with a blood pressure today of 150/80 and 150/70.  Blood pressure at his PCP was 120s blood pressure at home is running 120-130.  He has been continued on his current medication regimen of lisinopril /hydrochlorothiazide  20/25 mg daily.  He has not particularly interested in  additional antihypertensives unless necessary he has been encouraged to continue to monitor his pressures 1 to 2 hours postmedication administration at home as well.  He has also been encouraged to avoid high sodium prepackaged preprocessed foods.  Hyperlipidemia with LDL goal of less than 70.  LDL was 55 in the last last labs he is continued on rosuvastatin  20 mg daily.        Dispo: Patient to return to clinic to see MD/APP in 11 to 12 months or sooner if needed for reevaluation.  Signed, Tajh Livsey, NP

## 2024-10-11 ENCOUNTER — Ambulatory Visit: Attending: Cardiology | Admitting: Cardiology

## 2024-10-11 ENCOUNTER — Encounter: Payer: Self-pay | Admitting: Cardiology

## 2024-10-11 VITALS — BP 150/78 | HR 80 | Ht 67.0 in | Wt 194.2 lb

## 2024-10-11 DIAGNOSIS — I2510 Atherosclerotic heart disease of native coronary artery without angina pectoris: Secondary | ICD-10-CM | POA: Diagnosis not present

## 2024-10-11 DIAGNOSIS — R931 Abnormal findings on diagnostic imaging of heart and coronary circulation: Secondary | ICD-10-CM | POA: Diagnosis not present

## 2024-10-11 DIAGNOSIS — I10 Essential (primary) hypertension: Secondary | ICD-10-CM | POA: Diagnosis not present

## 2024-10-11 DIAGNOSIS — E785 Hyperlipidemia, unspecified: Secondary | ICD-10-CM | POA: Diagnosis not present

## 2024-10-11 NOTE — Patient Instructions (Signed)
 Medication Instructions:  Your physician recommends that you continue on your current medications as directed. Please refer to the Current Medication list given to you today.   *If you need a refill on your cardiac medications before your next appointment, please call your pharmacy*  Lab Work: No labs ordered today  If you have labs (blood work) drawn today and your tests are completely normal, you will receive your results only by: MyChart Message (if you have MyChart) OR A paper copy in the mail If you have any lab test that is abnormal or we need to change your treatment, we will call you to review the results.  Testing/Procedures: No test ordered today   Follow-Up: At [facility], you and your health needs are our priority.  As part of our continuing mission to provide you with exceptional heart care, our providers are all part of one team.  This team includes your primary Cardiologist (physician) and Advanced Practice Providers or APPs (Physician Assistants and Nurse Practitioners) who all work together to provide you with the care you need, when you need it.  Your next appointment:   12 month(s)  Provider:   You may see one of the following Advanced Practice Providers on your designated Care Team:   Lonni Meager, NP Lesley Maffucci, PA-C Bernardino Bring, PA-C Cadence Freeport, PA-C Tylene Lunch, NP Barnie Hila, NP

## 2024-10-12 NOTE — Addendum Note (Signed)
 Addended byBETHA GERARD FREDERICK on: 10/12/2024 05:38 PM   Modules accepted: Level of Service

## 2024-10-31 ENCOUNTER — Ambulatory Visit: Payer: Commercial Managed Care - PPO | Admitting: Dermatology

## 2024-11-06 ENCOUNTER — Other Ambulatory Visit: Payer: Self-pay | Admitting: Cardiology

## 2024-11-07 ENCOUNTER — Telehealth: Payer: Self-pay | Admitting: Cardiology

## 2024-11-07 NOTE — Telephone Encounter (Signed)
*  STAT* If patient is at the pharmacy, call can be transferred to refill team.   1. Which medications need to be refilled? (please list name of each medication and dose if known) rosuvastatin  (CRESTOR ) 20 MG tablet    2. Would you like to learn more about the convenience, safety, & potential cost savings by using the [facility]? No    3. Are you open to using the [facility] (Type [facility].  ). No    4. Which pharmacy/location (including street and city if local pharmacy) is medication to be sent to? [facility] - [place], [place] - [address]     5. Do they need a 30 day or 90 day supply? 90 day    Pt is out of medication

## 2024-11-08 MED ORDER — ROSUVASTATIN CALCIUM 20 MG PO TABS: 20.0000 mg | ORAL_TABLET | Freq: Every day | ORAL | 3 refills | Status: AC

## 2024-11-08 NOTE — Telephone Encounter (Signed)
 Refill sent.
